# Patient Record
Sex: Female | Born: 1945 | Race: White | Hispanic: No | Marital: Married | Smoking: Never smoker
Health system: Southern US, Community
[De-identification: ages and names within clinical notes are randomized; demographics above are authoritative.]

## PROBLEM LIST (undated history)

## (undated) DIAGNOSIS — R945 Abnormal results of liver function studies: Secondary | ICD-10-CM

## (undated) DIAGNOSIS — Z86010 Personal history of colonic polyps: Secondary | ICD-10-CM

## (undated) DIAGNOSIS — K219 Gastro-esophageal reflux disease without esophagitis: Secondary | ICD-10-CM

## (undated) DIAGNOSIS — L709 Acne, unspecified: Secondary | ICD-10-CM

## (undated) DIAGNOSIS — T7840XA Allergy, unspecified, initial encounter: Secondary | ICD-10-CM

## (undated) DIAGNOSIS — F419 Anxiety disorder, unspecified: Secondary | ICD-10-CM

## (undated) DIAGNOSIS — M8580 Other specified disorders of bone density and structure, unspecified site: Secondary | ICD-10-CM

## (undated) DIAGNOSIS — G709 Myoneural disorder, unspecified: Secondary | ICD-10-CM

## (undated) DIAGNOSIS — L210 Seborrhea capitis: Secondary | ICD-10-CM

## (undated) DIAGNOSIS — H269 Unspecified cataract: Secondary | ICD-10-CM

## (undated) DIAGNOSIS — R197 Diarrhea, unspecified: Secondary | ICD-10-CM

## (undated) DIAGNOSIS — J209 Acute bronchitis, unspecified: Secondary | ICD-10-CM

## (undated) DIAGNOSIS — H409 Unspecified glaucoma: Secondary | ICD-10-CM

## (undated) DIAGNOSIS — M1990 Unspecified osteoarthritis, unspecified site: Secondary | ICD-10-CM

## (undated) DIAGNOSIS — Z8619 Personal history of other infectious and parasitic diseases: Secondary | ICD-10-CM

## (undated) HISTORY — DX: Gastro-esophageal reflux disease without esophagitis: K21.9

## (undated) HISTORY — PX: EYE SURGERY: SHX253

## (undated) HISTORY — PX: BREAST SURGERY: SHX581

## (undated) HISTORY — PX: COLONOSCOPY: SHX174

## (undated) HISTORY — DX: Seborrhea capitis: L21.0

## (undated) HISTORY — DX: Diarrhea, unspecified: R19.7

## (undated) HISTORY — DX: Myoneural disorder, unspecified: G70.9

## (undated) HISTORY — DX: Unspecified glaucoma: H40.9

## (undated) HISTORY — DX: Allergy, unspecified, initial encounter: T78.40XA

## (undated) HISTORY — DX: Anxiety disorder, unspecified: F41.9

## (undated) HISTORY — DX: Personal history of other infectious and parasitic diseases: Z86.19

## (undated) HISTORY — DX: Abnormal results of liver function studies: R94.5

## (undated) HISTORY — DX: Personal history of colonic polyps: Z86.010

## (undated) HISTORY — DX: Acne, unspecified: L70.9

## (undated) HISTORY — DX: Unspecified osteoarthritis, unspecified site: M19.90

## (undated) HISTORY — DX: Other specified disorders of bone density and structure, unspecified site: M85.80

## (undated) HISTORY — DX: Acute bronchitis, unspecified: J20.9

## (undated) HISTORY — DX: Unspecified cataract: H26.9

---

## 1993-03-13 HISTORY — PX: BACK SURGERY: SHX140

## 2003-01-22 LAB — LIPID PANEL: HDL: 64 mg/dL (ref 35–70)

## 2006-03-13 LAB — HM COLONOSCOPY: HM COLON: NORMAL

## 2008-10-06 ENCOUNTER — Encounter: Admission: RE | Admit: 2008-10-06 | Discharge: 2008-10-06 | Payer: Self-pay | Admitting: Internal Medicine

## 2009-05-17 ENCOUNTER — Ambulatory Visit: Payer: Self-pay | Admitting: Diagnostic Radiology

## 2009-05-17 ENCOUNTER — Emergency Department (HOSPITAL_BASED_OUTPATIENT_CLINIC_OR_DEPARTMENT_OTHER): Admission: EM | Admit: 2009-05-17 | Discharge: 2009-05-17 | Payer: Self-pay | Admitting: Emergency Medicine

## 2010-01-13 ENCOUNTER — Encounter: Admission: RE | Admit: 2010-01-13 | Discharge: 2010-01-13 | Payer: Self-pay | Admitting: Gynecology

## 2010-06-06 LAB — DIFFERENTIAL
Basophils Relative: 2 % — ABNORMAL HIGH (ref 0–1)
Eosinophils Absolute: 0.1 10*3/uL (ref 0.0–0.7)
Lymphs Abs: 1.5 10*3/uL (ref 0.7–4.0)
Monocytes Absolute: 0.4 10*3/uL (ref 0.1–1.0)
Monocytes Relative: 8 % (ref 3–12)

## 2010-06-06 LAB — CBC
Hemoglobin: 13.6 g/dL (ref 12.0–15.0)
MCHC: 34 g/dL (ref 30.0–36.0)
MCV: 94.5 fL (ref 78.0–100.0)
RBC: 4.23 MIL/uL (ref 3.87–5.11)
WBC: 4.6 10*3/uL (ref 4.0–10.5)

## 2010-06-06 LAB — BASIC METABOLIC PANEL
CO2: 33 mEq/L — ABNORMAL HIGH (ref 19–32)
Chloride: 106 mEq/L (ref 96–112)
GFR calc Af Amer: >60 mL/min (ref >60)
Potassium: 4.4 mEq/L (ref 3.5–5.1)
Sodium: 145 mEq/L (ref 135–145)

## 2010-06-06 LAB — POCT CARDIAC MARKERS
Myoglobin, poc: 48.2 ng/mL (ref 12–200)
Troponin i, poc: <0.05 ng/mL (ref 0.00–0.09)

## 2010-12-30 ENCOUNTER — Other Ambulatory Visit: Payer: Self-pay | Admitting: Internal Medicine

## 2010-12-30 DIAGNOSIS — Z1231 Encounter for screening mammogram for malignant neoplasm of breast: Secondary | ICD-10-CM

## 2011-01-19 ENCOUNTER — Ambulatory Visit
Admission: RE | Admit: 2011-01-19 | Discharge: 2011-01-19 | Disposition: A | Discharge status: Home / Self Care | Payer: Federal, State, Local not specified - PPO | Source: Ambulatory Visit | Attending: Internal Medicine | Admitting: Internal Medicine

## 2011-01-19 DIAGNOSIS — Z1231 Encounter for screening mammogram for malignant neoplasm of breast: Secondary | ICD-10-CM

## 2011-12-29 ENCOUNTER — Other Ambulatory Visit: Payer: Self-pay | Admitting: Internal Medicine

## 2011-12-29 DIAGNOSIS — Z1231 Encounter for screening mammogram for malignant neoplasm of breast: Secondary | ICD-10-CM

## 2012-02-16 ENCOUNTER — Ambulatory Visit: Payer: Federal, State, Local not specified - PPO

## 2012-03-13 LAB — HM PAP SMEAR: HM Pap smear: NORMAL

## 2012-03-13 LAB — HM DEXA SCAN

## 2012-04-01 ENCOUNTER — Ambulatory Visit
Admission: RE | Admit: 2012-04-01 | Discharge: 2012-04-01 | Disposition: A | Discharge status: Home / Self Care | Payer: PRIVATE HEALTH INSURANCE | Source: Ambulatory Visit | Attending: Internal Medicine | Admitting: Internal Medicine

## 2012-04-01 DIAGNOSIS — Z1231 Encounter for screening mammogram for malignant neoplasm of breast: Secondary | ICD-10-CM

## 2012-05-20 ENCOUNTER — Other Ambulatory Visit: Payer: Self-pay | Admitting: Family Medicine

## 2012-05-20 DIAGNOSIS — M8580 Other specified disorders of bone density and structure, unspecified site: Secondary | ICD-10-CM

## 2012-06-04 ENCOUNTER — Ambulatory Visit
Admission: RE | Admit: 2012-06-04 | Discharge: 2012-06-04 | Disposition: A | Discharge status: Home / Self Care | Payer: PRIVATE HEALTH INSURANCE | Source: Ambulatory Visit | Attending: Family Medicine | Admitting: Family Medicine

## 2012-06-04 DIAGNOSIS — M8580 Other specified disorders of bone density and structure, unspecified site: Secondary | ICD-10-CM

## 2012-06-04 LAB — HM DEXA SCAN

## 2013-01-11 LAB — LIPID PANEL
Cholesterol: 196 mg/dL (ref 0–200)
LDL CALC: 103 mg/dL
LDL/HDL RATIO: 3.1
Triglycerides: 147 mg/dL (ref 40–160)

## 2013-01-11 LAB — HEPATIC FUNCTION PANEL
ALT: 21 U/L (ref 7–35)
AST: 17 U/L (ref 13–35)
Alkaline Phosphatase: 42 U/L (ref 25–125)
Bilirubin, Total: 0.6 mg/dL

## 2013-01-11 LAB — BASIC METABOLIC PANEL
Creatinine: 0.8 mg/dL (ref <=1.1)
Glucose: 93 mg/dL

## 2013-03-13 LAB — HM MAMMOGRAPHY: HM Mammogram: NORMAL

## 2013-03-31 ENCOUNTER — Ambulatory Visit: Payer: PRIVATE HEALTH INSURANCE | Admitting: Family Medicine

## 2013-05-09 ENCOUNTER — Ambulatory Visit (INDEPENDENT_AMBULATORY_CARE_PROVIDER_SITE_OTHER): Payer: PRIVATE HEALTH INSURANCE | Admitting: Family Medicine

## 2013-05-09 ENCOUNTER — Encounter: Payer: Self-pay | Admitting: Family Medicine

## 2013-05-09 VITALS — BP 122/80 | HR 73 | Temp 98.4°F | Resp 16 | Ht 69.5 in | Wt 206.0 lb

## 2013-05-09 DIAGNOSIS — E663 Overweight: Secondary | ICD-10-CM | POA: Insufficient documentation

## 2013-05-09 DIAGNOSIS — M5412 Radiculopathy, cervical region: Secondary | ICD-10-CM

## 2013-05-09 DIAGNOSIS — M4722 Other spondylosis with radiculopathy, cervical region: Secondary | ICD-10-CM

## 2013-05-09 DIAGNOSIS — H409 Unspecified glaucoma: Secondary | ICD-10-CM

## 2013-05-09 DIAGNOSIS — Z23 Encounter for immunization: Secondary | ICD-10-CM

## 2013-05-09 NOTE — Assessment & Plan Note (Signed)
New to provider, ongoing for pt.  Following w/ Dr Zadie Rhine and Herbert Deaner regularly.  Will follow along and assist as able.

## 2013-05-09 NOTE — Assessment & Plan Note (Signed)
New to provider, ongoing for pt.  Will take tramadol and ASA prn.  Has seen Dr Vertell Limber in the past.  Plan was to f/u as needed.

## 2013-05-09 NOTE — Progress Notes (Signed)
   Subjective:    Patient ID: Denise Haas, female    DOB: 10-19-1945, 68 y.o.   MRN: [id number]  HPI New to establish.  Previous MD- White (only saw a few times).  GYN- Mezzer.  Ophtho- [facility], [facility]cker.  CPE was Nov 2014, UTD on GYN, colonoscopy.  'i really have a hard time finding a doctor that i like'  'i'm not a snob at all'  Glaucoma- chronic problem, seeing multiple eye specialists and s/p procedures  Overweight- pt reports she is trying to eat less and avoid being sedentary.  Doing yoga twice weekly and low impact aerobics twice weekly, resistance band training twice weekly.  Will walk regularly when weather is nice.  Denies CP, SOB, HAs, edema.  Back pain- chronic problem, taking tramadol, ASA.  'Advanced degenerative changes of cervical spine'.  Has seen Dr Vertell Limber previously.  Will occasionally have weakness/numbness down L arm.  Pneumovax- due   Review of Systems For ROS see HPI     Objective:   Physical Exam  Vitals reviewed. Constitutional: She is oriented to person, place, and time. She appears well-developed and well-nourished. No distress.  HENT:  Head: Normocephalic and atraumatic.  Eyes: Conjunctivae and EOM are normal. Pupils are equal, round, and reactive to light.  Neck: Normal range of motion. Neck supple. No thyromegaly present.  Cardiovascular: Normal rate, regular rhythm, normal heart sounds and intact distal pulses.   No murmur heard. Pulmonary/Chest: Effort normal and breath sounds normal. No respiratory distress.  Abdominal: Soft. She exhibits no distension. There is no tenderness.  Musculoskeletal: She exhibits no edema.  Lymphadenopathy:    She has no cervical adenopathy.  Neurological: She is alert and oriented to person, place, and time.  Skin: Skin is warm and dry.  Psychiatric: She has a normal mood and affect. Her behavior is normal.          Assessment & Plan:

## 2013-05-09 NOTE — Assessment & Plan Note (Signed)
New.  Pt is attempting to follow healthy diet and does get a fair amount of exercise.  Encouraged her to keep track of food intake to determine her total caloric intake for the day.  Will follow.

## 2013-05-09 NOTE — Patient Instructions (Signed)
Schedule your complete physical for November Try and make healthy food choices and get regular exercise Keep up the good work!  You look great! Call with any questions or concerns Welcome!  We're glad to have you!!!

## 2013-05-10 ENCOUNTER — Encounter: Payer: Self-pay | Admitting: Family Medicine

## 2013-06-17 ENCOUNTER — Encounter: Payer: Self-pay | Admitting: General Practice

## 2013-09-05 ENCOUNTER — Encounter: Payer: Self-pay | Admitting: Family Medicine

## 2013-09-05 ENCOUNTER — Ambulatory Visit (INDEPENDENT_AMBULATORY_CARE_PROVIDER_SITE_OTHER): Payer: PRIVATE HEALTH INSURANCE | Admitting: Family Medicine

## 2013-09-05 VITALS — BP 108/64 | HR 95 | Temp 98.0°F | Resp 16 | Wt 207.1 lb

## 2013-09-05 DIAGNOSIS — M6740 Ganglion, unspecified site: Secondary | ICD-10-CM | POA: Insufficient documentation

## 2013-09-05 DIAGNOSIS — L989 Disorder of the skin and subcutaneous tissue, unspecified: Secondary | ICD-10-CM

## 2013-09-05 MED ORDER — ESTRADIOL 10 MCG VA TABS: 1.0000 | ORAL_TABLET | VAGINAL | Status: DC

## 2013-09-05 NOTE — Progress Notes (Signed)
Pre visit review using our clinic review tool, if applicable. No additional management support is needed unless otherwise documented below in the visit note. 

## 2013-09-05 NOTE — Patient Instructions (Signed)
Follow up as needed We'll call you with your hand specialist and derm appt Your refill was sent Call with any questions or concerns Have a great summer!

## 2013-09-05 NOTE — Progress Notes (Signed)
   Subjective:    Patient ID: TELINA KLECKLEY, female    DOB: [birth year], 68 y.o.   MRN: [id number]  HPI Knot on back of hand- R hand, noted 2 weeks ago.  No initial bruising or pain but subsequently bruised.  Knot remains.  Intermittently painful.  Skin lesions- pt has area on anterior lower R leg that she feels is concerning for basal cell carcinoma.  Has multiple 'crusting' lesions on face and L ear.  Has seen Dr Valli Glance and a [facility] and was unhappy w/ each.   Review of Systems For ROS see HPI     Objective:   Physical Exam  Vitals reviewed. Constitutional: She appears well-developed and well-nourished. No distress.  Cardiovascular: Intact distal pulses.   Musculoskeletal:  R wrist- normal ROM, soft tissue ~2 cm mass on dorsum of R hand consistent w/ ganglion cyst  Skin: Skin is warm and dry.  ~1 cm area of erythema and crusting of R lower leg Multiple dry scaling areas on face and ears          Assessment & Plan:

## 2013-09-07 NOTE — Assessment & Plan Note (Signed)
New.  Intermittently uncomfortable.  Will refer to hand specialist for evaluation and tx

## 2013-09-07 NOTE — Assessment & Plan Note (Signed)
New.  Pt concerned for basal cell carcinoma.  Refer to Derm for complete evaluation and tx.

## 2014-01-06 ENCOUNTER — Encounter: Payer: Self-pay | Admitting: Family Medicine

## 2014-02-03 ENCOUNTER — Other Ambulatory Visit: Payer: Self-pay | Admitting: Family Medicine

## 2014-02-03 ENCOUNTER — Encounter: Payer: Self-pay | Admitting: Family Medicine

## 2014-02-03 ENCOUNTER — Ambulatory Visit (INDEPENDENT_AMBULATORY_CARE_PROVIDER_SITE_OTHER): Payer: PRIVATE HEALTH INSURANCE | Admitting: Family Medicine

## 2014-02-03 VITALS — BP 112/64 | HR 81 | Temp 98.3°F | Resp 16 | Ht 69.5 in | Wt 209.1 lb

## 2014-02-03 DIAGNOSIS — Z0000 Encounter for general adult medical examination without abnormal findings: Secondary | ICD-10-CM | POA: Insufficient documentation

## 2014-02-03 LAB — CBC WITH DIFFERENTIAL/PLATELET
BASOS ABS: 0 10*3/uL (ref 0.0–0.1)
BASOS PCT: 0 % (ref 0–1)
EOS ABS: 0.2 10*3/uL (ref 0.0–0.7)
EOS PCT: 4 % (ref 0–5)
HEMATOCRIT: 40.5 % (ref 36.0–46.0)
Hemoglobin: 13.6 g/dL (ref 12.0–15.0)
LYMPHS PCT: 37 % (ref 12–46)
Lymphs Abs: 1.7 10*3/uL (ref 0.7–4.0)
MCH: 31.7 pg (ref 26.0–34.0)
MCHC: 33.6 g/dL (ref 30.0–36.0)
MCV: 94.4 fL (ref 78.0–100.0)
MONO ABS: 0.4 10*3/uL (ref 0.1–1.0)
MPV: 10.2 fL (ref 9.4–12.4)
Monocytes Relative: 8 % (ref 3–12)
Neutro Abs: 2.3 10*3/uL (ref 1.7–7.7)
Neutrophils Relative %: 51 % (ref 43–77)
Platelets: 321 10*3/uL (ref 150–400)
RBC: 4.29 MIL/uL (ref 3.87–5.11)
RDW: 13.6 % (ref 11.5–15.5)
WBC: 4.6 10*3/uL (ref 4.0–10.5)

## 2014-02-03 LAB — LIPID PANEL
CHOLESTEROL: 199 mg/dL (ref 0–200)
HDL: 56 mg/dL (ref >39)
LDL Cholesterol: 117 mg/dL — ABNORMAL HIGH (ref 0–99)
Total CHOL/HDL Ratio: 3.6 Ratio
Triglycerides: 131 mg/dL (ref <150)
VLDL: 26 mg/dL (ref 0–40)

## 2014-02-03 LAB — HEPATIC FUNCTION PANEL
ALT: 18 U/L (ref 0–35)
AST: 17 U/L (ref 0–37)
Albumin: 4.3 g/dL (ref 3.5–5.2)
Alkaline Phosphatase: 42 U/L (ref 39–117)
BILIRUBIN DIRECT: 0.1 mg/dL (ref 0.0–0.3)
BILIRUBIN INDIRECT: 0.4 mg/dL (ref 0.2–1.2)
Total Bilirubin: 0.5 mg/dL (ref 0.2–1.2)
Total Protein: 7.1 g/dL (ref 6.0–8.3)

## 2014-02-03 LAB — BASIC METABOLIC PANEL
BUN: 14 mg/dL (ref 6–23)
CO2: 30 mEq/L (ref 19–32)
CREATININE: 0.71 mg/dL (ref 0.50–1.10)
Calcium: 10 mg/dL (ref 8.4–10.5)
Chloride: 102 mEq/L (ref 96–112)
GLUCOSE: 91 mg/dL (ref 70–99)
Potassium: 4.7 mEq/L (ref 3.5–5.3)
Sodium: 140 mEq/L (ref 135–145)

## 2014-02-03 LAB — TSH: TSH: 1.443 u[IU]/mL (ref 0.350–4.500)

## 2014-02-03 MED ORDER — TRAMADOL HCL 50 MG PO TABS: 50.0000 mg | ORAL_TABLET | Freq: Two times a day (BID) | ORAL | Status: DC | PRN

## 2014-02-03 NOTE — Assessment & Plan Note (Signed)
Pt's PE WNL.  UTD on health maintenance.  Check labs.  Anticipatory guidance provided.  

## 2014-02-03 NOTE — Progress Notes (Signed)
Pre visit review using our clinic review tool, if applicable. No additional management support is needed unless otherwise documented below in the visit note. 

## 2014-02-03 NOTE — Patient Instructions (Signed)
Follow up in 1 year or as needed We'll notify you of your lab results and make any changes if needed Try and make healthy food choices and get regular exercise Call with any questions or concerns Happy Holidays!  Have a great trip!

## 2014-02-03 NOTE — Progress Notes (Signed)
   Subjective:    Patient ID: Denise Haas, female    DOB: Sep 30, 1945, 68 y.o.   MRN: [id number]  HPI CPE- UTD on colonoscopy, mammo, pap, DEXA.  GYN- Mezzer  Ophtho- [facility]   Review of Systems Patient reports no vision/ hearing changes, adenopathy,fever, weight change,  persistant/recurrent hoarseness , swallowing issues, chest pain, palpitations, edema, persistant/recurrent cough, hemoptysis, dyspnea (rest/exertional/paroxysmal nocturnal), gastrointestinal bleeding (melena, rectal bleeding), abdominal pain, significant heartburn, bowel changes, GU symptoms (dysuria, hematuria, incontinence), Gyn symptoms (abnormal  bleeding, pain),  syncope, focal weakness, memory loss, numbness & tingling, skin/hair/nail changes, abnormal bruising or bleeding, anxiety, or depression.     Objective:   Physical Exam General Appearance:    Alert, cooperative, no distress, appears stated age  Head:    Normocephalic, without obvious abnormality, atraumatic  Eyes:    PERRL, conjunctiva/corneas clear, EOM's intact, fundi    benign, both eyes  Ears:    Normal TM's and external ear canals, both ears  Nose:   Nares normal, septum midline, mucosa normal, no drainage    or sinus tenderness  Throat:   Lips, mucosa, and tongue normal; teeth and gums normal  Neck:   Supple, symmetrical, trachea midline, no adenopathy;    Thyroid: no enlargement/tenderness/nodules  Back:     Symmetric, no curvature, ROM normal, no CVA tenderness  Lungs:     Clear to auscultation bilaterally, respirations unlabored  Chest Wall:    No tenderness or deformity   Heart:    Regular rate and rhythm, S1 and S2 normal, no murmur, rub   or gallop  Breast Exam:    Deferred to GYN  Abdomen:     Soft, non-tender, bowel sounds active all four quadrants,    no masses, no organomegaly  Genitalia:    Deferred to GYN  Rectal:    Extremities:   Extremities normal, atraumatic, no cyanosis or edema  Pulses:   2+ and symmetric all extremities  Skin:    Skin color, texture, turgor normal, no rashes or lesions  Lymph nodes:   Cervical, supraclavicular, and axillary nodes normal  Neurologic:   CNII-XII intact, normal strength, sensation and reflexes    throughout          Assessment & Plan:

## 2014-02-04 ENCOUNTER — Encounter: Payer: Self-pay | Admitting: Family Medicine

## 2014-02-04 LAB — VITAMIN D 25 HYDROXY (VIT D DEFICIENCY, FRACTURES): VIT D 25 HYDROXY: 32 ng/mL (ref 30–100)

## 2014-04-09 ENCOUNTER — Other Ambulatory Visit: Payer: Self-pay | Admitting: Family Medicine

## 2014-04-09 ENCOUNTER — Encounter: Payer: Self-pay | Admitting: Family Medicine

## 2014-04-09 MED ORDER — TRAMADOL HCL 50 MG PO TABS: 50.0000 mg | ORAL_TABLET | Freq: Two times a day (BID) | ORAL | Status: DC | PRN

## 2014-04-09 NOTE — Telephone Encounter (Signed)
Med filled and faxed.  

## 2014-04-09 NOTE — Telephone Encounter (Signed)
Last OV 02-03-14 Tramadol last filled 02-03-14 #30 with 0

## 2014-04-09 NOTE — Telephone Encounter (Signed)
Ok to change pharmacy as requested.  [place] for #90, no refills

## 2014-08-11 ENCOUNTER — Encounter: Payer: Self-pay | Admitting: General Practice

## 2014-08-11 ENCOUNTER — Other Ambulatory Visit: Payer: Self-pay | Admitting: Family Medicine

## 2014-08-11 MED ORDER — TRAMADOL HCL 50 MG PO TABS: 50.0000 mg | ORAL_TABLET | Freq: Two times a day (BID) | ORAL | Status: DC | PRN

## 2014-08-11 NOTE — Telephone Encounter (Signed)
Pt notified, rx printed and placed at front desk with [facility].

## 2014-08-11 NOTE — Telephone Encounter (Signed)
Last OV 02-03-14 (CPE-Return in 1 year) Tramadol last filled 04/09/14 #90 with 0   NO CSC on file, pt has only received twice.

## 2014-08-11 NOTE — Telephone Encounter (Signed)
Will allow in PCP absence.  However she will need to pick up Rx and fill out [place] per office policy.

## 2014-08-24 ENCOUNTER — Encounter: Payer: Self-pay | Admitting: Family Medicine

## 2014-08-25 ENCOUNTER — Ambulatory Visit (HOSPITAL_BASED_OUTPATIENT_CLINIC_OR_DEPARTMENT_OTHER)
Admission: RE | Admit: 2014-08-25 | Discharge: 2014-08-25 | Disposition: A | Discharge status: Home / Self Care | Payer: PRIVATE HEALTH INSURANCE | Source: Ambulatory Visit | Attending: Medical | Admitting: Medical

## 2014-08-25 ENCOUNTER — Other Ambulatory Visit: Payer: Self-pay | Admitting: Medical

## 2014-08-25 ENCOUNTER — Ambulatory Visit (INDEPENDENT_AMBULATORY_CARE_PROVIDER_SITE_OTHER): Payer: PRIVATE HEALTH INSURANCE | Admitting: Medical

## 2014-08-25 ENCOUNTER — Encounter: Payer: Self-pay | Admitting: Medical

## 2014-08-25 VITALS — BP 130/90 | HR 92 | Temp 98.1°F | Ht 69.5 in | Wt 215.8 lb

## 2014-08-25 DIAGNOSIS — M542 Cervicalgia: Secondary | ICD-10-CM

## 2014-08-25 DIAGNOSIS — M47892 Other spondylosis, cervical region: Secondary | ICD-10-CM | POA: Insufficient documentation

## 2014-08-25 MED ORDER — HYDROCODONE-ACETAMINOPHEN 5-325 MG PO TABS: 1.0000 | ORAL_TABLET | Freq: Four times a day (QID) | ORAL | Status: DC | PRN

## 2014-08-25 MED ORDER — CYCLOBENZAPRINE HCL 10 MG PO TABS: 10.0000 mg | ORAL_TABLET | Freq: Every day | ORAL | Status: DC

## 2014-08-25 NOTE — Progress Notes (Signed)
Subjective:    Patient ID: Denise Haas, female    DOB: Oct 23, 1945, 69 y.o.   MRN: [id number]  HPI  Pt in states 1 wk of neck pain. Pt states just recently on past Wednesday pain came on in am. She was at beach and carrying some beach equipment day before. Pt has been taking advil, aspirin and tramadol. But pain level  7-8/10 is still present. Also some lifting of a lot of groceries while on vacation as well. Also has hx of cervical radiculopathy with degenerative joint disease.(For a while she states neck pain has been controlled.   No report of ha, no nausea, no fever, no vomiting.  No trauma or fall preceding neck pain.    Review of Systems  Constitutional: Negative for fever, chills and fatigue.  Respiratory: Negative for cough, chest tightness, shortness of breath and wheezing.   Cardiovascular: Negative for chest pain and palpitations.  Musculoskeletal: Positive for neck pain. Negative for back pain and arthralgias.       No radicular pain to arms.  Neurological: Negative for dizziness and headaches.  Hematological: Negative for adenopathy. Does not bruise/bleed easily.  Psychiatric/Behavioral: Negative for behavioral problems and confusion.    Past Medical History  Diagnosis Date  . Arthritis   . GERD (gastroesophageal reflux disease)   . Glaucoma   . History of colonic polyps     History   Social History  . Marital Status: Married    Spouse Name: N/A  . Number of Children: N/A  . Years of Education: N/A   Occupational History  . Not on file.   Social History Main Topics  . Smoking status: Never Smoker   . Smokeless tobacco: Not on file  . Alcohol Use: Yes  . Drug Use: No  . Sexual Activity: No   Other Topics Concern  . Not on file   Social History Narrative    Past Surgical History  Procedure Laterality Date  . Breast surgery    . Eye surgery    . Back surgery      Family History  Problem Relation Age of Onset  . Cancer Mother     breast    . Heart disease Mother   . Diabetes Mother   . COPD Father   . Heart disease Father   . Heart disease Maternal Grandmother   . Stroke Maternal Grandfather     Allergies  Allergen Reactions  . Amoxicillin Hives  . Aspirin Hives  . Erythromycin Hives and Rash  . Ibuprofen Hives  . Latex Rash  . Tetracyclines & Related Hives  . Triple Antibiotic [Bacitracin-Neomycin-Polymyxin] Rash    Current Outpatient Prescriptions on File Prior to Visit  Medication Sig Dispense Refill  . Biotin 300 MCG TABS Take 1 tablet by mouth daily.    . Calcium Carb-Cholecalciferol (CALCIUM + D3) 600-200 MG-UNIT TABS Take 1 tablet by mouth 2 (two) times daily.    . Cholecalciferol (VITAMIN D3) 2000 UNITS TABS Take 1 tablet by mouth daily.    . Cyanocobalamin (B-12) 2500 MCG TABS Take 1 tablet by mouth daily.    Marland Kitchen latanoprost (XALATAN) 0.005 % ophthalmic solution     . Melatonin 2.5 MG CAPS Take 1 capsule by mouth Nightly.    . metroNIDAZOLE (METROGEL) 0.75 % vaginal gel     . Polyvinyl Alcohol-Povidone (REFRESH OP) Apply to eye.    . Probiotic Product (PROBIOTIC DAILY PO) Take by mouth.    . traMADol (ULTRAM) 50 MG  tablet Take 1 tablet (50 mg total) by mouth every 12 (twelve) hours as needed. 90 tablet 0  . tretinoin (RETIN-A) 0.025 % cream Apply topically at bedtime.    . Estradiol (VAGIFEM) 10 MCG TABS vaginal tablet Place 1 tablet (10 mcg total) vaginally once a week. 12 tablet 3   No current facility-administered medications on file prior to visit.    BP 130/90 mmHg  Pulse 92  Temp(Src) 98.1 F (36.7 C) (Oral)  Ht 5' 9.5" (1.765 m)  Wt 215 lb 12.8 oz (97.886 kg)  BMI 31.42 kg/m2  SpO2 98%      Objective:   Physical Exam  General- No acute distress. Pleasant patient. Neck- Full range of motion, no jvd. But turning head to rt causes rt trap pain. Chin to chest rt trap pain. Faint mid c spine pain. Rt mid neck paracervical tenderness. Muscle feels tight. Lungs- Clear, even and  unlabored. Heart- regular rate and rhythm. Neurologic- CNII- XII grossly intact.  Upper ext- equal 5/5 symmetric strength.       Assessment & Plan:

## 2014-08-25 NOTE — Assessment & Plan Note (Signed)
Most of pain is on rt side. Some faint mid cspine. Will get xray. Rx flexeril. Rx hydrocodone. Stop tramadol.  Follow up 7 days or as needed.  If by Friday  No improved call and let us know. Expect some improvement by then.

## 2014-08-25 NOTE — Patient Instructions (Signed)
Neck pain Most of pain is on rt side. Some faint mid cspine. Will get xray. Rx flexeril. Rx hydrocodone. Stop tramadol.  Follow up 7 days or as needed.  If by Friday  No improved call and let us know. Expect some improvement by then.

## 2014-08-25 NOTE — Progress Notes (Signed)
Pre visit review using our clinic review tool, if applicable. No additional management support is needed unless otherwise documented below in the visit note. 

## 2014-09-06 ENCOUNTER — Other Ambulatory Visit: Payer: Self-pay | Admitting: Medical

## 2014-09-07 MED ORDER — CYCLOBENZAPRINE HCL 10 MG PO TABS: 10.0000 mg | ORAL_TABLET | Freq: Every day | ORAL | Status: DC

## 2014-11-06 ENCOUNTER — Encounter: Payer: Self-pay | Admitting: Family Medicine

## 2014-11-06 MED ORDER — TRAMADOL HCL 50 MG PO TABS: 50.0000 mg | ORAL_TABLET | Freq: Two times a day (BID) | ORAL | Status: DC | PRN

## 2014-11-06 NOTE — Telephone Encounter (Signed)
Medication filled to pharmacy as requested.   

## 2014-11-06 NOTE — Telephone Encounter (Signed)
Last OV 02/03/14, CPE scheduled for 01/2015 Tramadol last filled 08/10/14 #90 with 0 ([facility])

## 2014-11-19 ENCOUNTER — Ambulatory Visit (INDEPENDENT_AMBULATORY_CARE_PROVIDER_SITE_OTHER): Payer: PRIVATE HEALTH INSURANCE | Admitting: Family Medicine

## 2014-11-19 ENCOUNTER — Encounter: Payer: Self-pay | Admitting: Family Medicine

## 2014-11-19 VITALS — BP 118/72 | HR 97 | Temp 98.0°F | Resp 16 | Wt 216.2 lb

## 2014-11-19 DIAGNOSIS — M25512 Pain in left shoulder: Secondary | ICD-10-CM

## 2014-11-19 MED ORDER — TRETINOIN 0.025 % EX CREA: TOPICAL_CREAM | Freq: Every day | CUTANEOUS | Status: DC

## 2014-11-19 MED ORDER — PREDNISONE 10 MG PO TABS: ORAL_TABLET | ORAL | Status: DC

## 2014-11-19 NOTE — Patient Instructions (Signed)
Follow up by phone or MyChart in 2 weeks to let me know if we need to proceed w/ PT or ortho referral Start the Prednisone as directed- take w/ food Alternate heat and ice for the pain Call with any questions or concerns Hang in there!!!

## 2014-11-19 NOTE — Assessment & Plan Note (Signed)
New.  Pt's pain consistent w/ possible rotator cuff strain.  No evidence of biceps tendonitis or bursitis on PE.  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  Offered PT referral or sports med but pt declined at this time.  Reviewed supportive care and red flags that should prompt return.  Pt expressed understanding and is in agreement w/ plan.

## 2014-11-19 NOTE — Progress Notes (Signed)
   Subjective:    Patient ID: Denise Haas, female    DOB: 02/15/1946, 69 y.o.   MRN: [id number]  HPI L shoulder pain- sxs started 'at least 3 weeks' ago.  sxs started during water aerobics and pt thought pain would improve once class was over but sxs have persisted.  Painful to sleep on.  No improvement w/ tramadol.  Pain w/ certain movements.  Painful w/ internal rotation, pain w/ abduction and overhead motion.  No known injury.   Pain improves w/ ibuprofen use.  No swelling.  No hx of shoulder pain.   Review of Systems For ROS see HPI     Objective:   Physical Exam  Constitutional: She is oriented to person, place, and time. She appears well-developed and well-nourished. No distress.  HENT:  Head: Normocephalic and atraumatic.  Neck: Normal range of motion. Neck supple.  Cardiovascular: Intact distal pulses.   Musculoskeletal: She exhibits no edema or tenderness (not TTP over L clavicle, humeral head, scapula).  Discomfort w/ abduction of L arm starting at 90 degrees, pain w/ internal rotation  Neurological: She is alert and oriented to person, place, and time. She has normal reflexes.  Skin: Skin is warm and dry. No rash noted. No erythema.  Psychiatric: She has a normal mood and affect. Her behavior is normal. Thought content normal.  Vitals reviewed.         Assessment & Plan:

## 2014-11-19 NOTE — Progress Notes (Signed)
Pre visit review using our clinic review tool, if applicable. No additional management support is needed unless otherwise documented below in the visit note. 

## 2014-12-14 ENCOUNTER — Telehealth: Payer: Self-pay | Admitting: Family Medicine

## 2014-12-14 NOTE — Telephone Encounter (Signed)
Caller name: Denise Haas   Relationship to patient: Self   Can be reached: [phone]  Reason for call: Pt says that her shoulder is still in pain. She would like to have a referral to ortho. She says there are a few ortho places on [address] that would be convenient for her.

## 2014-12-15 ENCOUNTER — Encounter: Payer: Self-pay | Admitting: Family Medicine

## 2014-12-15 ENCOUNTER — Other Ambulatory Visit: Payer: Self-pay | Admitting: Family Medicine

## 2014-12-15 DIAGNOSIS — M25512 Pain in left shoulder: Secondary | ICD-10-CM

## 2014-12-15 NOTE — Telephone Encounter (Signed)
[facility] for ortho referral- please specify in [employer]

## 2014-12-15 NOTE — Telephone Encounter (Signed)
Referral placed.

## 2015-01-25 ENCOUNTER — Other Ambulatory Visit: Payer: Self-pay | Admitting: Family Medicine

## 2015-01-25 MED ORDER — PREDNISONE 10 MG PO TABS: ORAL_TABLET | ORAL | Status: DC

## 2015-01-25 MED ORDER — TRAMADOL HCL 50 MG PO TABS: 50.0000 mg | ORAL_TABLET | Freq: Two times a day (BID) | ORAL | Status: DC | PRN

## 2015-01-25 NOTE — Telephone Encounter (Signed)
Medication filled to pharmacy as requested.   

## 2015-01-25 NOTE — Telephone Encounter (Signed)
Last OV 11/19/14 Tramadol last filled 11/06/14 #90 with 0

## 2015-01-25 NOTE — Addendum Note (Signed)
Addended by: Midge Minium on: 01/25/2015 03:45 PM   Modules accepted: Orders

## 2015-02-08 ENCOUNTER — Encounter: Payer: Medicare Other | Admitting: Family Medicine

## 2015-02-15 ENCOUNTER — Encounter: Payer: Self-pay | Admitting: Behavioral Health

## 2015-02-15 ENCOUNTER — Telehealth: Payer: Self-pay | Admitting: Behavioral Health

## 2015-02-15 NOTE — Telephone Encounter (Signed)
Pre-Visit Call completed with patient and chart updated.   Pre-Visit Info documented in Specialty Comments under [employer].    

## 2015-02-16 ENCOUNTER — Ambulatory Visit (INDEPENDENT_AMBULATORY_CARE_PROVIDER_SITE_OTHER): Payer: PRIVATE HEALTH INSURANCE | Admitting: Family Medicine

## 2015-02-16 ENCOUNTER — Encounter: Payer: Self-pay | Admitting: Family Medicine

## 2015-02-16 ENCOUNTER — Other Ambulatory Visit: Payer: Self-pay | Admitting: Family Medicine

## 2015-02-16 VITALS — BP 134/74 | HR 100 | Temp 98.7°F | Ht 70.0 in | Wt 218.2 lb

## 2015-02-16 DIAGNOSIS — M4722 Other spondylosis with radiculopathy, cervical region: Secondary | ICD-10-CM

## 2015-02-16 DIAGNOSIS — Z23 Encounter for immunization: Secondary | ICD-10-CM | POA: Diagnosis not present

## 2015-02-16 DIAGNOSIS — M25512 Pain in left shoulder: Secondary | ICD-10-CM

## 2015-02-16 DIAGNOSIS — E663 Overweight: Secondary | ICD-10-CM | POA: Diagnosis not present

## 2015-02-16 DIAGNOSIS — R7989 Other specified abnormal findings of blood chemistry: Secondary | ICD-10-CM | POA: Diagnosis not present

## 2015-02-16 DIAGNOSIS — E782 Mixed hyperlipidemia: Secondary | ICD-10-CM

## 2015-02-16 DIAGNOSIS — R945 Abnormal results of liver function studies: Secondary | ICD-10-CM

## 2015-02-16 DIAGNOSIS — Z8619 Personal history of other infectious and parasitic diseases: Secondary | ICD-10-CM | POA: Insufficient documentation

## 2015-02-16 DIAGNOSIS — J209 Acute bronchitis, unspecified: Secondary | ICD-10-CM

## 2015-02-16 DIAGNOSIS — M47812 Spondylosis without myelopathy or radiculopathy, cervical region: Secondary | ICD-10-CM

## 2015-02-16 LAB — COMPREHENSIVE METABOLIC PANEL
ALK PHOS: 49 U/L (ref 33–130)
ALT: 32 U/L — AB (ref 6–29)
AST: 32 U/L (ref 10–35)
Albumin: 3.9 g/dL (ref 3.6–5.1)
BILIRUBIN TOTAL: 0.4 mg/dL (ref 0.2–1.2)
BUN: 11 mg/dL (ref 7–25)
CALCIUM: 9.2 mg/dL (ref 8.6–10.4)
CO2: 30 mmol/L (ref 20–31)
CREATININE: 0.93 mg/dL (ref 0.50–0.99)
Chloride: 106 mmol/L (ref 98–110)
GLUCOSE: 98 mg/dL (ref 65–99)
Potassium: 4.7 mmol/L (ref 3.5–5.3)
Sodium: 141 mmol/L (ref 135–146)
Total Protein: 6.6 g/dL (ref 6.1–8.1)

## 2015-02-16 LAB — CBC WITH DIFFERENTIAL/PLATELET
BASOS ABS: 0 10*3/uL (ref 0.0–0.1)
Basophils Relative: 0 % (ref 0–1)
EOS PCT: 3 % (ref 0–5)
Eosinophils Absolute: 0.2 10*3/uL (ref 0.0–0.7)
HEMATOCRIT: 39.5 % (ref 36.0–46.0)
HEMOGLOBIN: 12.7 g/dL (ref 12.0–15.0)
LYMPHS PCT: 34 % (ref 12–46)
Lymphs Abs: 2 10*3/uL (ref 0.7–4.0)
MCH: 30.9 pg (ref 26.0–34.0)
MCHC: 32.2 g/dL (ref 30.0–36.0)
MCV: 96.1 fL (ref 78.0–100.0)
MPV: 9.6 fL (ref 8.6–12.4)
Monocytes Absolute: 0.5 10*3/uL (ref 0.1–1.0)
Monocytes Relative: 8 % (ref 3–12)
NEUTROS ABS: 3.2 10*3/uL (ref 1.7–7.7)
Neutrophils Relative %: 55 % (ref 43–77)
Platelets: 294 10*3/uL (ref 150–400)
RBC: 4.11 MIL/uL (ref 3.87–5.11)
RDW: 13.6 % (ref 11.5–15.5)
WBC: 5.8 10*3/uL (ref 4.0–10.5)

## 2015-02-16 LAB — LIPID PANEL
CHOLESTEROL: 187 mg/dL (ref 125–200)
HDL: 59 mg/dL (ref >=46)
LDL Cholesterol: 94 mg/dL (ref <130)
TRIGLYCERIDES: 169 mg/dL — AB (ref <150)
Total CHOL/HDL Ratio: 3.2 Ratio (ref <=5.0)
VLDL: 34 mg/dL — ABNORMAL HIGH (ref <30)

## 2015-02-16 MED ORDER — CIPROFLOXACIN HCL 250 MG PO TABS: 250.0000 mg | ORAL_TABLET | Freq: Two times a day (BID) | ORAL | Status: DC

## 2015-02-16 NOTE — Patient Instructions (Signed)
Elderberry or liquid or gummies, [employer] company at [email or web address] Vitamin C 500 to 1000 mg daily, aged garlic or black garlic, zinc 50 mg or coldeeze, Mucinex twice daily  Preventive Care for Adults, Female A healthy lifestyle and preventive care can promote health and wellness. Preventive health guidelines for women include the following key practices.  A routine yearly physical is a good way to check with your health care provider about your health and preventive screening. It is a chance to share any concerns and updates on your health and to receive a thorough exam.  Visit your dentist for a routine exam and preventive care every 6 months. Brush your teeth twice a day and floss once a day. Good oral hygiene prevents tooth decay and gum disease.  The frequency of eye exams is based on your age, health, family medical history, use of contact lenses, and other factors. Follow your health care provider's recommendations for frequency of eye exams.  Eat a healthy diet. Foods like vegetables, fruits, whole grains, low-fat dairy products, and lean protein foods contain the nutrients you need without too many calories. Decrease your intake of foods high in solid fats, added sugars, and salt. Eat the right amount of calories for you.Get information about a proper diet from your health care provider, if necessary.  Regular physical exercise is one of the most important things you can do for your health. Most adults should get at least 150 minutes of moderate-intensity exercise (any activity that increases your heart rate and causes you to sweat) each week. In addition, most adults need muscle-strengthening exercises on 2 or more days a week.  Maintain a healthy weight. The body mass index (BMI) is a screening tool to identify possible weight problems. It provides an estimate of body fat based on height and weight. Your health care provider can find your BMI and can help you achieve or maintain a healthy  weight.For adults 20 years and older:  A BMI below 18.5 is considered underweight.  A BMI of 18.5 to 24.9 is normal.  A BMI of 25 to 29.9 is considered overweight.  A BMI of 30 and above is considered obese.  Maintain normal blood lipids and cholesterol levels by exercising and minimizing your intake of saturated fat. Eat a balanced diet with plenty of fruit and vegetables. Blood tests for lipids and cholesterol should begin at age 5 and be repeated every 5 years. If your lipid or cholesterol levels are high, you are over 50, or you are at high risk for heart disease, you may need your cholesterol levels checked more frequently.Ongoing high lipid and cholesterol levels should be treated with medicines if diet and exercise are not working.  If you smoke, find out from your health care provider how to quit. If you do not use tobacco, do not start.  Lung cancer screening is recommended for adults aged 30-80 years who are at high risk for developing lung cancer because of a history of smoking. A yearly low-dose CT scan of the lungs is recommended for people who have at least a 30-pack-year history of smoking and are a current smoker or have quit within the past 15 years. A pack year of smoking is smoking an average of 1 pack of cigarettes a day for 1 year (for example: 1 pack a day for 30 years or 2 packs a day for 15 years). Yearly screening should continue until the smoker has stopped smoking for at least 15 years. Yearly screening should  be stopped for people who develop a health problem that would prevent them from having lung cancer treatment.  If you are pregnant, do not drink alcohol. If you are breastfeeding, be very cautious about drinking alcohol. If you are not pregnant and choose to drink alcohol, do not have more than 1 drink per day. One drink is considered to be 12 ounces (355 mL) of beer, 5 ounces (148 mL) of wine, or 1.5 ounces (44 mL) of liquor.  Avoid use of street drugs. Do not  share needles with anyone. Ask for help if you need support or instructions about stopping the use of drugs.  High blood pressure causes heart disease and increases the risk of stroke. Your blood pressure should be checked at least every 1 to 2 years. Ongoing high blood pressure should be treated with medicines if weight loss and exercise do not work.  If you are 19-66 years old, ask your health care provider if you should take aspirin to prevent strokes.  Diabetes screening is done by taking a blood sample to check your blood glucose level after you have not eaten for a certain period of time (fasting). If you are not overweight and you do not have risk factors for diabetes, you should be screened once every 3 years starting at age 60. If you are overweight or obese and you are 21-46 years of age, you should be screened for diabetes every year as part of your cardiovascular risk assessment.  Breast cancer screening is essential preventive care for women. You should practice "breast self-awareness." This means understanding the normal appearance and feel of your breasts and may include breast self-examination. Any changes detected, no matter how small, should be reported to a health care provider. Women in their 48s and 30s should have a clinical breast exam (CBE) by a health care provider as part of a regular health exam every 1 to 3 years. After age 65, women should have a CBE every year. Starting at age 57, women should consider having a mammogram (breast X-ray test) every year. Women who have a family history of breast cancer should talk to their health care provider about genetic screening. Women at a high risk of breast cancer should talk to their health care providers about having an MRI and a mammogram every year.  Breast cancer gene (BRCA)-related cancer risk assessment is recommended for women who have family members with BRCA-related cancers. BRCA-related cancers include breast, ovarian, tubal,  and peritoneal cancers. Having family members with these cancers may be associated with an increased risk for harmful changes (mutations) in the breast cancer genes BRCA1 and BRCA2. Results of the assessment will determine the need for genetic counseling and BRCA1 and BRCA2 testing.  Your health care provider may recommend that you be screened regularly for cancer of the pelvic organs (ovaries, uterus, and vagina). This screening involves a pelvic examination, including checking for microscopic changes to the surface of your cervix (Pap test). You may be encouraged to have this screening done every 3 years, beginning at age 31.  For women ages 37-65, health care providers may recommend pelvic exams and Pap testing every 3 years, or they may recommend the Pap and pelvic exam, combined with testing for human papilloma virus (HPV), every 5 years. Some types of HPV increase your risk of cervical cancer. Testing for HPV may also be done on women of any age with unclear Pap test results.  Other health care providers may not recommend any screening  for nonpregnant women who are considered low risk for pelvic cancer and who do not have symptoms. Ask your health care provider if a screening pelvic exam is right for you.  If you have had past treatment for cervical cancer or a condition that could lead to cancer, you need Pap tests and screening for cancer for at least 20 years after your treatment. If Pap tests have been discontinued, your risk factors (such as having a new sexual partner) need to be reassessed to determine if screening should resume. Some women have medical problems that increase the chance of getting cervical cancer. In these cases, your health care provider may recommend more frequent screening and Pap tests.  Colorectal cancer can be detected and often prevented. Most routine colorectal cancer screening begins at the age of 49 years and continues through age 46 years. However, your health care  provider may recommend screening at an earlier age if you have risk factors for colon cancer. On a yearly basis, your health care provider may provide home test kits to check for hidden blood in the stool. Use of a small camera at the end of a tube, to directly examine the colon (sigmoidoscopy or colonoscopy), can detect the earliest forms of colorectal cancer. Talk to your health care provider about this at age 47, when routine screening begins. Direct exam of the colon should be repeated every 5-10 years through age 15 years, unless early forms of precancerous polyps or small growths are found.  People who are at an increased risk for hepatitis B should be screened for this virus. You are considered at high risk for hepatitis B if:  You were born in a country where hepatitis B occurs often. Talk with your health care provider about which countries are considered high risk.  Your parents were born in a high-risk country and you have not received a shot to protect against hepatitis B (hepatitis B vaccine).  You have HIV or AIDS.  You use needles to inject street drugs.  You live with, or have sex with, someone who has hepatitis B.  You get hemodialysis treatment.  You take certain medicines for conditions like cancer, organ transplantation, and autoimmune conditions.  Hepatitis C blood testing is recommended for all people born from [birth year] through 1965 and any individual with known risks for hepatitis C.  Practice safe sex. Use condoms and avoid high-risk sexual practices to reduce the spread of sexually transmitted infections (STIs). STIs include gonorrhea, chlamydia, syphilis, trichomonas, herpes, HPV, and human immunodeficiency virus (HIV). Herpes, HIV, and HPV are viral illnesses that have no cure. They can result in disability, cancer, and death.  You should be screened for sexually transmitted illnesses (STIs) including gonorrhea and chlamydia if:  You are sexually active and are younger  than 24 years.  You are older than 24 years and your health care provider tells you that you are at risk for this type of infection.  Your sexual activity has changed since you were last screened and you are at an increased risk for chlamydia or gonorrhea. Ask your health care provider if you are at risk.  If you are at risk of being infected with HIV, it is recommended that you take a prescription medicine daily to prevent HIV infection. This is called preexposure prophylaxis (PrEP). You are considered at risk if:  You are sexually active and do not regularly use condoms or know the HIV status of your partner(s).  You take drugs by injection.  You are sexually active with a partner who has HIV.  Talk with your health care provider about whether you are at high risk of being infected with HIV. If you choose to begin PrEP, you should first be tested for HIV. You should then be tested every 3 months for as long as you are taking PrEP.  Osteoporosis is a disease in which the bones lose minerals and strength with aging. This can result in serious bone fractures or breaks. The risk of osteoporosis can be identified using a bone density scan. Women ages 68 years and over and women at risk for fractures or osteoporosis should discuss screening with their health care providers. Ask your health care provider whether you should take a calcium supplement or vitamin D to reduce the rate of osteoporosis.  Menopause can be associated with physical symptoms and risks. Hormone replacement therapy is available to decrease symptoms and risks. You should talk to your health care provider about whether hormone replacement therapy is right for you.  Use sunscreen. Apply sunscreen liberally and repeatedly throughout the day. You should seek shade when your shadow is shorter than you. Protect yourself by wearing long sleeves, pants, a wide-brimmed hat, and sunglasses year round, whenever you are outdoors.  Once a  month, do a whole body skin exam, using a mirror to look at the skin on your back. Tell your health care provider of new moles, moles that have irregular borders, moles that are larger than a pencil eraser, or moles that have changed in shape or color.  Stay current with required vaccines (immunizations).  Influenza vaccine. All adults should be immunized every year.  Tetanus, diphtheria, and acellular pertussis (Td, Tdap) vaccine. Pregnant women should receive 1 dose of Tdap vaccine during each pregnancy. The dose should be obtained regardless of the length of time since the last dose. Immunization is preferred during the 27th-36th week of gestation. An adult who has not previously received Tdap or who does not know her vaccine status should receive 1 dose of Tdap. This initial dose should be followed by tetanus and diphtheria toxoids (Td) booster doses every 10 years. Adults with an unknown or incomplete history of completing a 3-dose immunization series with Td-containing vaccines should begin or complete a primary immunization series including a Tdap dose. Adults should receive a Td booster every 10 years.  Varicella vaccine. An adult without evidence of immunity to varicella should receive 2 doses or a second dose if she has previously received 1 dose. Pregnant females who do not have evidence of immunity should receive the first dose after pregnancy. This first dose should be obtained before leaving the health care facility. The second dose should be obtained 4-8 weeks after the first dose.  Human papillomavirus (HPV) vaccine. Females aged 13-26 years who have not received the vaccine previously should obtain the 3-dose series. The vaccine is not recommended for use in pregnant females. However, pregnancy testing is not needed before receiving a dose. If a female is found to be pregnant after receiving a dose, no treatment is needed. In that case, the remaining doses should be delayed until after the  pregnancy. Immunization is recommended for any person with an immunocompromised condition through the age of 76 years if she did not get any or all doses earlier. During the 3-dose series, the second dose should be obtained 4-8 weeks after the first dose. The third dose should be obtained 24 weeks after the first dose and 16 weeks after the  second dose.  Zoster vaccine. One dose is recommended for adults aged 72 years or older unless certain conditions are present.  Measles, mumps, and rubella (MMR) vaccine. Adults born before [birth year] generally are considered immune to measles and mumps. Adults born in [birth year] or later should have 1 or more doses of MMR vaccine unless there is a contraindication to the vaccine or there is laboratory evidence of immunity to each of the three diseases. A routine second dose of MMR vaccine should be obtained at least 28 days after the first dose for students attending postsecondary schools, health care workers, or international travelers. People who received inactivated measles vaccine or an unknown type of measles vaccine during 1963-1967 should receive 2 doses of MMR vaccine. People who received inactivated mumps vaccine or an unknown type of mumps vaccine before 1979 and are at high risk for mumps infection should consider immunization with 2 doses of MMR vaccine. For females of childbearing age, rubella immunity should be determined. If there is no evidence of immunity, females who are not pregnant should be vaccinated. If there is no evidence of immunity, females who are pregnant should delay immunization until after pregnancy. Unvaccinated health care workers born before [birth year] who lack laboratory evidence of measles, mumps, or rubella immunity or laboratory confirmation of disease should consider measles and mumps immunization with 2 doses of MMR vaccine or rubella immunization with 1 dose of MMR vaccine.  Pneumococcal 13-valent conjugate (PCV13) vaccine. When indicated, a person  who is uncertain of his immunization history and has no record of immunization should receive the PCV13 vaccine. All adults 67 years of age and older should receive this vaccine. An adult aged 17 years or older who has certain medical conditions and has not been previously immunized should receive 1 dose of PCV13 vaccine. This PCV13 should be followed with a dose of pneumococcal polysaccharide (PPSV23) vaccine. Adults who are at high risk for pneumococcal disease should obtain the PPSV23 vaccine at least 8 weeks after the dose of PCV13 vaccine. Adults older than 68 years of age who have normal immune system function should obtain the PPSV23 vaccine dose at least 1 year after the dose of PCV13 vaccine.  Pneumococcal polysaccharide (PPSV23) vaccine. When PCV13 is also indicated, PCV13 should be obtained first. All adults aged 93 years and older should be immunized. An adult younger than age 39 years who has certain medical conditions should be immunized. Any person who resides in a nursing home or long-term care facility should be immunized. An adult smoker should be immunized. People with an immunocompromised condition and certain other conditions should receive both PCV13 and PPSV23 vaccines. People with human immunodeficiency virus (HIV) infection should be immunized as soon as possible after diagnosis. Immunization during chemotherapy or radiation therapy should be avoided. Routine use of PPSV23 vaccine is not recommended for American Indians, [place] Natives, or people younger than 65 years unless there are medical conditions that require PPSV23 vaccine. When indicated, people who have unknown immunization and have no record of immunization should receive PPSV23 vaccine. One-time revaccination 5 years after the first dose of PPSV23 is recommended for people aged 19-64 years who have chronic kidney failure, nephrotic syndrome, asplenia, or immunocompromised conditions. People who received 1-2 doses of PPSV23  before age 7 years should receive another dose of PPSV23 vaccine at age 66 years or later if at least 5 years have passed since the previous dose. Doses of PPSV23 are not needed for people immunized with PPSV23 at or  after age 5 years.  Meningococcal vaccine. Adults with asplenia or persistent complement component deficiencies should receive 2 doses of quadrivalent meningococcal conjugate (MenACWY-D) vaccine. The doses should be obtained at least 2 months apart. Microbiologists working with certain meningococcal bacteria, [employer] recruits, people at risk during an outbreak, and people who travel to or live in countries with a high rate of meningitis should be immunized. A first-year college student up through age 43 years who is living in a residence hall should receive a dose if she did not receive a dose on or after her 16th birthday. Adults who have certain high-risk conditions should receive one or more doses of vaccine.  Hepatitis A vaccine. Adults who wish to be protected from this disease, have certain high-risk conditions, work with hepatitis A-infected animals, work in hepatitis A research labs, or travel to or work in countries with a high rate of hepatitis A should be immunized. Adults who were previously unvaccinated and who anticipate close contact with an international adoptee during the first 60 days after arrival in the [place] from a country with a high rate of hepatitis A should be immunized.  Hepatitis B vaccine. Adults who wish to be protected from this disease, have certain high-risk conditions, may be exposed to blood or other infectious body fluids, are household contacts or sex partners of hepatitis B positive people, are clients or workers in certain care facilities, or travel to or work in countries with a high rate of hepatitis B should be immunized.  Haemophilus influenzae type b (Hib) vaccine. A previously unvaccinated person with asplenia or sickle cell disease or  having a scheduled splenectomy should receive 1 dose of Hib vaccine. Regardless of previous immunization, a recipient of a hematopoietic stem cell transplant should receive a 3-dose series 6-12 months after her successful transplant. Hib vaccine is not recommended for adults with HIV infection. Preventive Services / Frequency Ages 3 to 36 years  Blood pressure check.** / Every 3-5 years.  Lipid and cholesterol check.** / Every 5 years beginning at age 4.  Clinical breast exam.** / Every 3 years for women in their 39s and 70s.  BRCA-related cancer risk assessment.** / For women who have family members with a BRCA-related cancer (breast, ovarian, tubal, or peritoneal cancers).  Pap test.** / Every 2 years from ages 59 through 92. Every 3 years starting at age 5 through age 22 or 10 with a history of 3 consecutive normal Pap tests.  HPV screening.** / Every 3 years from ages 37 through ages 81 to 83 with a history of 3 consecutive normal Pap tests.  Hepatitis C blood test.** / For any individual with known risks for hepatitis C.  Skin self-exam. / Monthly.  Influenza vaccine. / Every year.  Tetanus, diphtheria, and acellular pertussis (Tdap, Td) vaccine.** / Consult your health care provider. Pregnant women should receive 1 dose of Tdap vaccine during each pregnancy. 1 dose of Td every 10 years.  Varicella vaccine.** / Consult your health care provider. Pregnant females who do not have evidence of immunity should receive the first dose after pregnancy.  HPV vaccine. / 3 doses over 6 months, if 76 and younger. The vaccine is not recommended for use in pregnant females. However, pregnancy testing is not needed before receiving a dose.  Measles, mumps, rubella (MMR) vaccine.** / You need at least 1 dose of MMR if you were born in 1957 or later. You may also need a 2nd dose. For females of childbearing age,  rubella immunity should be determined. If there is no evidence of immunity, females  who are not pregnant should be vaccinated. If there is no evidence of immunity, females who are pregnant should delay immunization until after pregnancy.  Pneumococcal 13-valent conjugate (PCV13) vaccine.** / Consult your health care provider.  Pneumococcal polysaccharide (PPSV23) vaccine.** / 1 to 2 doses if you smoke cigarettes or if you have certain conditions.  Meningococcal vaccine.** / 1 dose if you are age 73 to 65 years and a Market researcher living in a residence hall, or have one of several medical conditions, you need to get vaccinated against meningococcal disease. You may also need additional booster doses.  Hepatitis A vaccine.** / Consult your health care provider.  Hepatitis B vaccine.** / Consult your health care provider.  Haemophilus influenzae type b (Hib) vaccine.** / Consult your health care provider. Ages 43 to 39 years  Blood pressure check.** / Every year.  Lipid and cholesterol check.** / Every 5 years beginning at age 64 years.  Lung cancer screening. / Every year if you are aged 42-80 years and have a 30-pack-year history of smoking and currently smoke or have quit within the past 15 years. Yearly screening is stopped once you have quit smoking for at least 15 years or develop a health problem that would prevent you from having lung cancer treatment.  Clinical breast exam.** / Every year after age 13 years.  BRCA-related cancer risk assessment.** / For women who have family members with a BRCA-related cancer (breast, ovarian, tubal, or peritoneal cancers).  Mammogram.** / Every year beginning at age 47 years and continuing for as long as you are in good health. Consult with your health care provider.  Pap test.** / Every 3 years starting at age 3 years through age 21 or 33 years with a history of 3 consecutive normal Pap tests.  HPV screening.** / Every 3 years from ages 51 years through ages 50 to 48 years with a history of 3 consecutive normal  Pap tests.  Fecal occult blood test (FOBT) of stool. / Every year beginning at age 4 years and continuing until age 44 years. You may not need to do this test if you get a colonoscopy every 10 years.  Flexible sigmoidoscopy or colonoscopy.** / Every 5 years for a flexible sigmoidoscopy or every 10 years for a colonoscopy beginning at age 14 years and continuing until age 32 years.  Hepatitis C blood test.** / For all people born from [birth year] through 1964 and any individual with known risks for hepatitis C.  Skin self-exam. / Monthly.  Influenza vaccine. / Every year.  Tetanus, diphtheria, and acellular pertussis (Tdap/Td) vaccine.** / Consult your health care provider. Pregnant women should receive 1 dose of Tdap vaccine during each pregnancy. 1 dose of Td every 10 years.  Varicella vaccine.** / Consult your health care provider. Pregnant females who do not have evidence of immunity should receive the first dose after pregnancy.  Zoster vaccine.** / 1 dose for adults aged 24 years or older.  Measles, mumps, rubella (MMR) vaccine.** / You need at least 1 dose of MMR if you were born in 1957 or later. You may also need a second dose. For females of childbearing age, rubella immunity should be determined. If there is no evidence of immunity, females who are not pregnant should be vaccinated. If there is no evidence of immunity, females who are pregnant should delay immunization until after pregnancy.  Pneumococcal 13-valent conjugate (PCV13) vaccine.** /  Consult your health care provider.  Pneumococcal polysaccharide (PPSV23) vaccine.** / 1 to 2 doses if you smoke cigarettes or if you have certain conditions.  Meningococcal vaccine.** / Consult your health care provider.  Hepatitis A vaccine.** / Consult your health care provider.  Hepatitis B vaccine.** / Consult your health care provider.  Haemophilus influenzae type b (Hib) vaccine.** / Consult your health care provider. Ages 75 years  and over  Blood pressure check.** / Every year.  Lipid and cholesterol check.** / Every 5 years beginning at age 54 years.  Lung cancer screening. / Every year if you are aged 28-80 years and have a 30-pack-year history of smoking and currently smoke or have quit within the past 15 years. Yearly screening is stopped once you have quit smoking for at least 15 years or develop a health problem that would prevent you from having lung cancer treatment.  Clinical breast exam.** / Every year after age 35 years.  BRCA-related cancer risk assessment.** / For women who have family members with a BRCA-related cancer (breast, ovarian, tubal, or peritoneal cancers).  Mammogram.** / Every year beginning at age 35 years and continuing for as long as you are in good health. Consult with your health care provider.  Pap test.** / Every 3 years starting at age 99 years through age 94 or 24 years with 3 consecutive normal Pap tests. Testing can be stopped between 65 and 70 years with 3 consecutive normal Pap tests and no abnormal Pap or HPV tests in the past 10 years.  HPV screening.** / Every 3 years from ages 59 years through ages 52 or 24 years with a history of 3 consecutive normal Pap tests. Testing can be stopped between 65 and 70 years with 3 consecutive normal Pap tests and no abnormal Pap or HPV tests in the past 10 years.  Fecal occult blood test (FOBT) of stool. / Every year beginning at age 32 years and continuing until age 84 years. You may not need to do this test if you get a colonoscopy every 10 years.  Flexible sigmoidoscopy or colonoscopy.** / Every 5 years for a flexible sigmoidoscopy or every 10 years for a colonoscopy beginning at age 35 years and continuing until age 39 years.  Hepatitis C blood test.** / For all people born from [birth year] through 1964 and any individual with known risks for hepatitis C.  Osteoporosis screening.** / A one-time screening for women ages 7 years and over and  women at risk for fractures or osteoporosis.  Skin self-exam. / Monthly.  Influenza vaccine. / Every year.  Tetanus, diphtheria, and acellular pertussis (Tdap/Td) vaccine.** / 1 dose of Td every 10 years.  Varicella vaccine.** / Consult your health care provider.  Zoster vaccine.** / 1 dose for adults aged 24 years or older.  Pneumococcal 13-valent conjugate (PCV13) vaccine.** / Consult your health care provider.  Pneumococcal polysaccharide (PPSV23) vaccine.** / 1 dose for all adults aged 89 years and older.  Meningococcal vaccine.** / Consult your health care provider.  Hepatitis A vaccine.** / Consult your health care provider.  Hepatitis B vaccine.** / Consult your health care provider.  Haemophilus influenzae type b (Hib) vaccine.** / Consult your health care provider. ** Family history and personal history of risk and conditions may change your health care provider's recommendations.   This information is not intended to replace advice given to you by your health care provider. Make sure you discuss any questions you have with your health care provider.  Document Released: 04/25/2001 Document Revised: 03/20/2014 Document Reviewed: 07/25/2010 Elsevier Interactive Patient Education [employer].

## 2015-02-16 NOTE — Progress Notes (Signed)
Subjective:    Patient ID: Denise Haas, female    DOB: Jun 01, 1945, 69 y.o.   MRN: [id number]  Chief Complaint  Patient presents with  . Annual Exam    HPI Patient is in today for new patient appointment. Her doctor is leaving this practice. She has been struggling with cough and congestion for several days now. She's been also struggling with fatigue, headaches and malaise. She notes postnasal drip productive of yellow phlegm. No fevers or chills. No other acute complaints. Denies CP/palp/SOB/HA/congestion/fevers/GI or GU c/o. Taking meds as prescribed  Past Medical History  Diagnosis Date  . Arthritis   . GERD (gastroesophageal reflux disease)   . Glaucoma   . History of colonic polyps   . History of chicken pox   . H/O measles   . H/O mumps   . Abnormal liver function test 02/20/2015  . Acute bronchitis 02/20/2015    Past Surgical History  Procedure Laterality Date  . Back surgery  1995    cage placed in lumbar  . Eye surgery      cataracts b/l  . Breast surgery      benign left breast biopsy for cyst    Family History  Problem Relation Age of Onset  . Cancer Mother     breast  . Heart disease Mother   . Diabetes Mother   . Asthma Mother   . Hyperlipidemia Mother   . Peripheral vascular disease Mother   . COPD Father   . Heart disease Father   . Stroke Maternal Grandmother   . Heart disease Maternal Grandfather   . Cancer Paternal Grandfather     throat  . Glaucoma Sister   . Glaucoma Brother   . Heart disease Brother   . Glaucoma Brother   . Cancer Brother     skin cancer, lost tip of thumb  . Glaucoma Brother   . Crohn's disease Brother     colostomy in place    Social History   Social History  . Marital Status: Married    Spouse Name: N/A  . Number of Children: N/A  . Years of Education: N/A   Occupational History  . Not on file.   Social History Main Topics  . Smoking status: Never Smoker   . Smokeless tobacco: Not on file  .  Alcohol Use: Yes  . Drug Use: No  . 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 Not on file   Social History Narrative    Outpatient Prescriptions Prior to Visit  Medication Sig Dispense Refill  . Biotin 300 MCG TABS Take 1 tablet by mouth daily.    . Calcium Carb-Cholecalciferol (CALCIUM + D3) 600-200 MG-UNIT TABS Take 1 tablet by mouth 2 (two) times daily.    . Cholecalciferol (VITAMIN D3) 2000 UNITS TABS Take 1 tablet by mouth daily.    . Cyanocobalamin (B-12) 2500 MCG TABS Take 1 tablet by mouth daily.    Marland Kitchen latanoprost (XALATAN) 0.005 % ophthalmic solution     . Melatonin 2.5 MG CAPS Take 1 capsule by mouth Nightly.    . Polyvinyl Alcohol-Povidone (REFRESH OP) Apply to eye.    . Probiotic Product (PROBIOTIC DAILY PO) Take by mouth.    . traMADol (ULTRAM) 50 MG tablet Take 1 tablet (50 mg total) by mouth every 12 (twelve) hours as needed. 90 tablet 0  . tretinoin (  RETIN-A) 0.025 % cream Apply topically at bedtime. 45 g 3  . Estradiol (VAGIFEM) 10 MCG TABS vaginal tablet Place 1 tablet (10 mcg total) vaginally once a week. (Patient not taking: Reported on 02/15/2015) 12 tablet 3   No facility-administered medications prior to visit.    Allergies  Allergen Reactions  . Amoxicillin Hives  . Aspirin Hives  . Erythromycin Hives and Rash  . Ibuprofen Hives  . Latex Rash  . Tetracyclines & Related Hives  . Triple Antibiotic [Bacitracin-Neomycin-Polymyxin] Rash    Review of Systems  Constitutional: Positive for malaise/fatigue. Negative for fever.  HENT: Positive for congestion and sore throat.   Eyes: Negative for discharge.  Respiratory: Positive for cough and sputum production. Negative for shortness of breath.   Cardiovascular: Negative for chest pain, palpitations and leg swelling.  Gastrointestinal: Negative for nausea and abdominal pain.  Genitourinary: Negative for dysuria.   Musculoskeletal: Negative for falls.  Skin: Negative for rash.  Neurological: Positive for headaches. Negative for loss of consciousness.  Endo/Heme/Allergies: Negative for environmental allergies.  Psychiatric/Behavioral: Negative for depression. The patient is not nervous/anxious.        Objective:    Physical Exam  Constitutional: She is oriented to person, place, and time. She appears well-developed and well-nourished. No distress.  HENT:  Head: Normocephalic and atraumatic.  Eyes: Conjunctivae are normal.  Neck: Neck supple. No thyromegaly present.  Cardiovascular: Normal rate, regular rhythm and normal heart sounds.   No murmur heard. Pulmonary/Chest: Effort normal and breath sounds normal. No respiratory distress.  Abdominal: Soft. Bowel sounds are normal. She exhibits no distension and no mass. There is no tenderness.  Musculoskeletal: She exhibits no edema.  Lymphadenopathy:    She has no cervical adenopathy.  Neurological: She is alert and oriented to person, place, and time.  Skin: Skin is warm and dry.  Psychiatric: She has a normal mood and affect. Her behavior is normal.    BP 134/74 mmHg  Pulse 100  Temp(Src) 98.7 F (37.1 C) (Oral)  Ht 5\' 10"  (1.778 m)  Wt 218 lb 4 oz (98.998 kg)  BMI 31.32 kg/m2  SpO2 97% Wt Readings from Last 3 Encounters:  02/16/15 218 lb 4 oz (98.998 kg)  11/19/14 216 lb 4 oz (98.09 kg)  08/25/14 215 lb 12.8 oz (97.886 kg)     Lab Results  Component Value Date   WBC 5.8 02/16/2015   HGB 12.7 02/16/2015   HCT 39.5 02/16/2015   PLT 294 02/16/2015   GLUCOSE 98 02/16/2015   CHOL 187 02/16/2015   TRIG 169* 02/16/2015   HDL 59 02/16/2015   LDLCALC 94 02/16/2015   ALT 32* 02/16/2015   AST 32 02/16/2015   NA 141 02/16/2015   K 4.7 02/16/2015   CL 106 02/16/2015   CREATININE 0.93 02/16/2015   BUN 11 02/16/2015   CO2 30 02/16/2015   TSH 1.883 02/16/2015    Lab Results  Component Value Date   TSH 1.883 02/16/2015   Lab  Results  Component Value Date   WBC 5.8 02/16/2015   HGB 12.7 02/16/2015   HCT 39.5 02/16/2015   MCV 96.1 02/16/2015   PLT 294 02/16/2015   Lab Results  Component Value Date   NA 141 02/16/2015   K 4.7 02/16/2015   CO2 30 02/16/2015   GLUCOSE 98 02/16/2015   BUN 11 02/16/2015   CREATININE 0.93 02/16/2015   BILITOT 0.4 02/16/2015   ALKPHOS 49 02/16/2015   AST 32 02/16/2015  ALT 32* 02/16/2015   PROT 6.6 02/16/2015   ALBUMIN 3.9 02/16/2015   CALCIUM 9.2 02/16/2015   Lab Results  Component Value Date   CHOL 187 02/16/2015   Lab Results  Component Value Date   HDL 59 02/16/2015   Lab Results  Component Value Date   LDLCALC 94 02/16/2015   Lab Results  Component Value Date   TRIG 169* 02/16/2015   Lab Results  Component Value Date   CHOLHDL 3.2 02/16/2015   No results found for: HGBA1C     Assessment & Plan:   Problem List Items Addressed This Visit    Abnormal liver function test    Mild, minimize simple carbohydrates      Acute bronchitis    Started on Ciprofloxacin, Mucinex and probiotics      Cervical radiculopathy due to degenerative joint disease of spine    Encouraged moist heat and gentle stretching as tolerated. May try NSAIDs and prescription meds as directed and report if symptoms worsen or seek immediate care      Hyperlipidemia, mixed    Encouraged heart healthy diet, increase exercise, avoid trans fats, consider a krill oil cap daily       Left shoulder pain - Primary    Rescheduled MRI due to cough, still achy is going to schedule MRI for 12/18 encouraged to proceed. Has an       Overweight    Encouraged DASH diet, decrease po intake and increase exercise as tolerated. Needs 7-8 hours of sleep nightly. Avoid trans fats, eat small, frequent meals every 4-5 hours with lean proteins, complex carbs and healthy fats. Minimize simple carbs       Other Visit Diagnoses    Need for vaccination with 13-polyvalent pneumococcal conjugate  vaccine        Relevant Orders    Pneumococcal conjugate vaccine 13-valent (Completed)       I have changed Ms. Foerster's ciprofloxacin. I am also having her maintain her latanoprost, Vitamin D3, Calcium + D3, Melatonin, B-12, Biotin, Probiotic Product (PROBIOTIC DAILY PO), Polyvinyl Alcohol-Povidone (REFRESH OP), Estradiol, tretinoin, and traMADol.  Meds ordered this encounter  Medications  . DISCONTD: ciprofloxacin (CIPRO) 250 MG tablet    Sig: Take 250 mg by mouth 2 (two) times daily. Take for 7 days  . ciprofloxacin (CIPRO) 250 MG tablet    Sig: Take 1 tablet (250 mg total) by mouth 2 (two) times daily. Take for 7 days    Dispense:  14 tablet    Refill:  0     Penni Homans, MD

## 2015-02-16 NOTE — Progress Notes (Signed)
Pre visit review using our clinic review tool, if applicable. No additional management support is needed unless otherwise documented below in the visit note. 

## 2015-02-16 NOTE — Assessment & Plan Note (Signed)
Rescheduled MRI due to cough, still achy is going to schedule MRI for 12/18 encouraged to proceed. Has an

## 2015-02-17 LAB — TSH: TSH: 1.883 u[IU]/mL (ref 0.350–4.500)

## 2015-02-18 ENCOUNTER — Encounter: Payer: Self-pay | Admitting: Family Medicine

## 2015-02-20 ENCOUNTER — Encounter: Payer: Self-pay | Admitting: Family Medicine

## 2015-02-20 DIAGNOSIS — R945 Abnormal results of liver function studies: Secondary | ICD-10-CM

## 2015-02-20 DIAGNOSIS — R7989 Other specified abnormal findings of blood chemistry: Secondary | ICD-10-CM

## 2015-02-20 DIAGNOSIS — J209 Acute bronchitis, unspecified: Secondary | ICD-10-CM

## 2015-02-20 DIAGNOSIS — E782 Mixed hyperlipidemia: Secondary | ICD-10-CM | POA: Insufficient documentation

## 2015-02-20 HISTORY — DX: Abnormal results of liver function studies: R94.5

## 2015-02-20 HISTORY — DX: Other specified abnormal findings of blood chemistry: R79.89

## 2015-02-20 HISTORY — DX: Acute bronchitis, unspecified: J20.9

## 2015-02-20 NOTE — Assessment & Plan Note (Signed)
Encouraged moist heat and gentle stretching as tolerated. May try NSAIDs and prescription meds as directed and report if symptoms worsen or seek immediate care 

## 2015-02-20 NOTE — Assessment & Plan Note (Signed)
Encouraged heart healthy diet, increase exercise, avoid trans fats, consider a krill oil cap daily 

## 2015-02-20 NOTE — Assessment & Plan Note (Signed)
Started on Ciprofloxacin, Mucinex and probiotics

## 2015-02-20 NOTE — Assessment & Plan Note (Signed)
Mild, minimize simple carbohydrates

## 2015-02-20 NOTE — Assessment & Plan Note (Signed)
Encouraged DASH diet, decrease po intake and increase exercise as tolerated. Needs 7-8 hours of sleep nightly. Avoid trans fats, eat small, frequent meals every 4-5 hours with lean proteins, complex carbs and healthy fats. Minimize simple carbs 

## 2015-02-25 ENCOUNTER — Telehealth: Payer: Self-pay | Admitting: Family Medicine

## 2015-02-25 NOTE — Telephone Encounter (Signed)
Caller name: Self   Can be reached: [phone]  Pharmacy:  [place], [place] - [place] [phone] (Phone) [phone] (Fax)         Reason for call: Patient is having MRI done on Sunday 12/18 and is claustrophobic, she is requesting something to take prior to having it done to be relaxed. States her husband will be driving

## 2015-02-26 MED ORDER — LORAZEPAM 0.5 MG PO TABS: ORAL_TABLET | ORAL | Status: DC

## 2015-02-26 NOTE — Telephone Encounter (Signed)
OK to send her in Lorazepam 0.5 mg tabs 1 tab 1 hour prior to procedure and may take one at procedure prn if she has a driver. Disp #2

## 2015-02-26 NOTE — Telephone Encounter (Signed)
Printed and faxed to [facility]	[place]. Patient informed

## 2015-02-26 NOTE — Telephone Encounter (Signed)
Printed and on counter for signature. 

## 2015-03-01 DIAGNOSIS — M75112 Incomplete rotator cuff tear or rupture of left shoulder, not specified as traumatic: Secondary | ICD-10-CM | POA: Insufficient documentation

## 2015-03-05 ENCOUNTER — Encounter: Payer: Self-pay | Admitting: Gastroenterology

## 2015-05-06 ENCOUNTER — Encounter: Payer: Medicare Other | Admitting: Gastroenterology

## 2015-05-08 ENCOUNTER — Telehealth: Payer: Self-pay | Admitting: Family Medicine

## 2015-05-13 ENCOUNTER — Telehealth: Payer: Self-pay | Admitting: Family Medicine

## 2015-05-13 NOTE — Telephone Encounter (Signed)
Pt is requesting refill on Tramadol.  Last OV: 02/16/2015 Last Fill: 01/25/2015 #90 and 0RF  Please advise.

## 2015-05-13 NOTE — Telephone Encounter (Signed)
OK to refill Tramadol, same sig, same number.  

## 2015-05-14 MED ORDER — TRAMADOL HCL 50 MG PO TABS: 50.0000 mg | ORAL_TABLET | Freq: Two times a day (BID) | ORAL | Status: DC | PRN

## 2015-05-14 NOTE — Addendum Note (Signed)
Addended by: Sharon Seller B on: 05/14/2015 03:06 PM   Modules accepted: Orders

## 2015-05-14 NOTE — Telephone Encounter (Signed)
Faxed hardcopy for Tramadol to [facility].

## 2015-06-22 MED ORDER — TRAMADOL HCL 50 MG PO TABS: 50.0000 mg | ORAL_TABLET | Freq: Two times a day (BID) | ORAL | Status: DC | PRN

## 2015-06-22 NOTE — Telephone Encounter (Signed)
Rx printed, awaiting DO signature.  

## 2015-06-22 NOTE — Addendum Note (Signed)
Addended byDamita Dunnings D on: 06/22/2015 01:16 PM   Modules accepted: Orders

## 2015-06-22 NOTE — Telephone Encounter (Signed)
Refill x1 only 

## 2015-06-22 NOTE — Telephone Encounter (Signed)
Rx faxed to [facility] pharmacy.  

## 2015-06-22 NOTE — Telephone Encounter (Signed)
Pt is requesting refill on Tramadol. Dr. Frederik Pear Pt.  Last OV: 02/16/2015 Last Fill: 05/14/2015 #90 and 0RF Pt sig: 1 tablet q12h PRN UDS: None  Please advise.

## 2015-08-30 ENCOUNTER — Encounter: Payer: Self-pay | Admitting: Family Medicine

## 2015-08-30 ENCOUNTER — Ambulatory Visit (INDEPENDENT_AMBULATORY_CARE_PROVIDER_SITE_OTHER): Payer: No Typology Code available for payment source | Admitting: Family Medicine

## 2015-08-30 VITALS — BP 110/72 | HR 94 | Temp 98.7°F | Ht 70.0 in | Wt 216.4 lb

## 2015-08-30 DIAGNOSIS — M25512 Pain in left shoulder: Secondary | ICD-10-CM

## 2015-08-30 DIAGNOSIS — E663 Overweight: Secondary | ICD-10-CM

## 2015-08-30 DIAGNOSIS — E782 Mixed hyperlipidemia: Secondary | ICD-10-CM | POA: Diagnosis not present

## 2015-08-30 MED ORDER — TRETINOIN 0.025 % EX CREA: TOPICAL_CREAM | Freq: Every day | CUTANEOUS | Status: DC

## 2015-08-30 NOTE — Assessment & Plan Note (Addendum)
Slight weight loss, Encouraged DASH diet, decrease po intake and increase exercise as tolerated. Needs 7-8 hours of sleep nightly. Avoid trans fats, eat small, frequent meals every 4-5 hours with lean proteins, complex carbs and healthy fats. Minimize simple carbs

## 2015-08-30 NOTE — Progress Notes (Signed)
Pre visit review using our clinic review tool, if applicable. No additional management support is needed unless otherwise documented below in the visit note. 

## 2015-08-30 NOTE — Patient Instructions (Signed)

## 2015-09-05 NOTE — Assessment & Plan Note (Signed)
Encouraged moist heat and gentle stretching as tolerated. May try NSAIDs and prescription meds as directed and report if symptoms worsen or seek immediate care 

## 2015-09-05 NOTE — Assessment & Plan Note (Signed)
Encouraged heart healthy diet, increase exercise, avoid trans fats, consider a krill oil cap daily 

## 2015-09-05 NOTE — Progress Notes (Signed)
Patient ID: Denise Haas, female   DOB: 11-30-1945, 70 y.o.   MRN: [id number]   Subjective:    Patient ID: Denise Haas, female    DOB: 1945/08/02, 70 y.o.   MRN: [id number]  Chief Complaint  Patient presents with  . Follow-up    HPI Patient is in today for follow up. Is doing well today. Notes occasional discharge from right ear but no pain. Notes some intermittent left shoulder pain, follows with orthopaedics and some steroid injections can be helpful. Denies CP/palp/SOB/HA/congestion/fevers/GI or GU c/o. Taking meds as prescribed  Past Medical History  Diagnosis Date  . Arthritis   . GERD (gastroesophageal reflux disease)   . Glaucoma   . History of colonic polyps   . History of chicken pox   . H/O measles   . H/O mumps   . Abnormal liver function test 02/20/2015  . Acute bronchitis 02/20/2015    Past Surgical History  Procedure Laterality Date  . Back surgery  1995    cage placed in lumbar  . Eye surgery      cataracts b/l  . Breast surgery      benign left breast biopsy for cyst    Family History  Problem Relation Age of Onset  . Cancer Mother     breast  . Heart disease Mother   . Diabetes Mother   . Asthma Mother   . Hyperlipidemia Mother   . Peripheral vascular disease Mother   . COPD Father   . Heart disease Father   . Stroke Maternal Grandmother   . Heart disease Maternal Grandfather   . Cancer Paternal Grandfather     throat  . Glaucoma Sister   . Glaucoma Brother   . Heart disease Brother   . Glaucoma Brother   . Cancer Brother     skin cancer, lost tip of thumb  . Glaucoma Brother   . Crohn's disease Brother     colostomy in place    Social History   Social History  . Marital Status: Married    Spouse Name: N/A  . Number of Children: N/A  . Years of Education: N/A   Occupational History  . Not on file.   Social History Main Topics  . Smoking status: Never Smoker   . Smokeless tobacco: Not on file  . Alcohol Use: Yes  . Drug  Use: No  . 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 Not on file   Social History Narrative    Outpatient Prescriptions Prior to Visit  Medication Sig Dispense Refill  . Biotin 300 MCG TABS Take 1 tablet by mouth daily.    . Calcium Carb-Cholecalciferol (CALCIUM + D3) 600-200 MG-UNIT TABS Take 1 tablet by mouth 2 (two) times daily.    . Cholecalciferol (VITAMIN D3) 2000 UNITS TABS Take 1 tablet by mouth daily.    . Cyanocobalamin (B-12) 2500 MCG TABS Take 1 tablet by mouth daily.    Marland Kitchen latanoprost (XALATAN) 0.005 % ophthalmic solution     . Melatonin 2.5 MG CAPS Take 1 capsule by mouth Nightly.    . Polyvinyl Alcohol-Povidone (REFRESH OP) Apply to eye.    . Probiotic Product (PROBIOTIC DAILY PO) Take by mouth.    . traMADol (ULTRAM) 50 MG tablet Take 1 tablet (50 mg total) by mouth every 12 (twelve) hours as needed. 90 tablet 0  .  tretinoin (RETIN-A) 0.025 % cream Apply topically at bedtime. 45 g 3  . ciprofloxacin (CIPRO) 250 MG tablet Take 1 tablet (250 mg total) by mouth 2 (two) times daily. Take for 7 days 14 tablet 0  . Estradiol (VAGIFEM) 10 MCG TABS vaginal tablet Place 1 tablet (10 mcg total) vaginally once a week. (Patient not taking: Reported on 02/15/2015) 12 tablet 3  . LORazepam (ATIVAN) 0.5 MG tablet Take 1 by mouth one hour prior to procedure. 1 tablet 0   No facility-administered medications prior to visit.    Allergies  Allergen Reactions  . Amoxicillin Hives  . Aspirin Hives  . Erythromycin Hives and Rash  . Ibuprofen Hives  . Latex Rash  . Tetracyclines & Related Hives  . Triple Antibiotic [Bacitracin-Neomycin-Polymyxin] Rash    Review of Systems  Constitutional: Negative for fever and malaise/fatigue.  HENT: Negative for congestion.   Eyes: Negative for blurred vision.  Respiratory: Negative for shortness of breath.   Cardiovascular: Negative for chest  pain, palpitations and leg swelling.  Gastrointestinal: Negative for nausea, abdominal pain and blood in stool.  Genitourinary: Negative for dysuria and frequency.  Musculoskeletal: Positive for back pain and joint pain. Negative for falls.  Skin: Negative for rash.  Neurological: Negative for dizziness, loss of consciousness and headaches.  Endo/Heme/Allergies: Negative for environmental allergies.  Psychiatric/Behavioral: Negative for depression. The patient is not nervous/anxious.        Objective:    Physical Exam  Constitutional: She is oriented to person, place, and time. She appears well-developed and well-nourished. No distress.  HENT:  Head: Normocephalic and atraumatic.  Nose: Nose normal.  Eyes: Right eye exhibits no discharge. Left eye exhibits no discharge.  Neck: Normal range of motion. Neck supple.  Cardiovascular: Normal rate and regular rhythm.   No murmur heard. Pulmonary/Chest: Effort normal and breath sounds normal.  Abdominal: Soft. Bowel sounds are normal. There is no tenderness.  Musculoskeletal: She exhibits no edema.  Neurological: She is alert and oriented to person, place, and time.  Skin: Skin is warm and dry.  Psychiatric: She has a normal mood and affect.  Nursing note and vitals reviewed.   BP 110/72 mmHg  Pulse 94  Temp(Src) 98.7 F (37.1 C) (Oral)  Ht 5\' 10"  (1.778 m)  Wt 216 lb 6 oz (98.147 kg)  BMI 31.05 kg/m2  SpO2 96% Wt Readings from Last 3 Encounters:  08/30/15 216 lb 6 oz (98.147 kg)  02/16/15 218 lb 4 oz (98.998 kg)  11/19/14 216 lb 4 oz (98.09 kg)     Lab Results  Component Value Date   WBC 5.8 02/16/2015   HGB 12.7 02/16/2015   HCT 39.5 02/16/2015   PLT 294 02/16/2015   GLUCOSE 98 02/16/2015   CHOL 187 02/16/2015   TRIG 169* 02/16/2015   HDL 59 02/16/2015   LDLCALC 94 02/16/2015   ALT 32* 02/16/2015   AST 32 02/16/2015   NA 141 02/16/2015   K 4.7 02/16/2015   CL 106 02/16/2015   CREATININE 0.93 02/16/2015   BUN  11 02/16/2015   CO2 30 02/16/2015   TSH 1.883 02/16/2015    Lab Results  Component Value Date   TSH 1.883 02/16/2015   Lab Results  Component Value Date   WBC 5.8 02/16/2015   HGB 12.7 02/16/2015   HCT 39.5 02/16/2015   MCV 96.1 02/16/2015   PLT 294 02/16/2015   Lab Results  Component Value Date   NA 141 02/16/2015   K  4.7 02/16/2015   CO2 30 02/16/2015   GLUCOSE 98 02/16/2015   BUN 11 02/16/2015   CREATININE 0.93 02/16/2015   BILITOT 0.4 02/16/2015   ALKPHOS 49 02/16/2015   AST 32 02/16/2015   ALT 32* 02/16/2015   PROT 6.6 02/16/2015   ALBUMIN 3.9 02/16/2015   CALCIUM 9.2 02/16/2015   Lab Results  Component Value Date   CHOL 187 02/16/2015   Lab Results  Component Value Date   HDL 59 02/16/2015   Lab Results  Component Value Date   LDLCALC 94 02/16/2015   Lab Results  Component Value Date   TRIG 169* 02/16/2015   Lab Results  Component Value Date   CHOLHDL 3.2 02/16/2015   No results found for: HGBA1C     Assessment & Plan:   Problem List Items Addressed This Visit    Overweight - Primary    Slight weight loss, Encouraged DASH diet, decrease po intake and increase exercise as tolerated. Needs 7-8 hours of sleep nightly. Avoid trans fats, eat small, frequent meals every 4-5 hours with lean proteins, complex carbs and healthy fats. Minimize simple carbs      Left shoulder pain    Encouraged moist heat and gentle stretching as tolerated. May try NSAIDs and prescription meds as directed and report if symptoms worsen or seek immediate care      Hyperlipidemia, mixed    Encouraged heart healthy diet, increase exercise, avoid trans fats, consider a krill oil cap daily         I have discontinued Denise Haas's Estradiol, ciprofloxacin, and LORazepam. I am also having her maintain her latanoprost, Vitamin D3, Calcium + D3, Melatonin, B-12, Biotin, Probiotic Product (PROBIOTIC DAILY PO), Polyvinyl Alcohol-Povidone (REFRESH OP), traMADol, and  tretinoin.  Meds ordered this encounter  Medications  . tretinoin (RETIN-A) 0.025 % cream    Sig: Apply topically at bedtime.    Dispense:  45 g    Refill:  3     Penni Homans, MD

## 2015-10-05 ENCOUNTER — Telehealth: Payer: Self-pay

## 2015-10-05 NOTE — Telephone Encounter (Signed)
Received PA approval effective 10/05/2015 through 10/05/2018. PA approval faxed to [place] [facility] and sent for scanning.

## 2015-10-05 NOTE — Telephone Encounter (Signed)
Received PA form for Retin-A/Tretinoin cream 0.025 % from [facility]. Form completed, stamped w/ PCP signature in absence and faxed back to [place] successfully at [phone]. Form sent for scanning. Awaiting determination.

## 2015-11-22 ENCOUNTER — Other Ambulatory Visit: Payer: Self-pay | Admitting: Family Medicine

## 2015-11-22 MED ORDER — TRETINOIN 0.025 % EX CREA: TOPICAL_CREAM | Freq: Every day | CUTANEOUS | 1 refills | Status: DC

## 2015-11-27 ENCOUNTER — Other Ambulatory Visit: Payer: Self-pay | Admitting: Family Medicine

## 2015-11-29 ENCOUNTER — Other Ambulatory Visit: Payer: Self-pay | Admitting: Family Medicine

## 2015-11-29 MED ORDER — TRAMADOL HCL 50 MG PO TABS: 50.0000 mg | ORAL_TABLET | Freq: Two times a day (BID) | ORAL | 0 refills | Status: DC | PRN

## 2015-11-29 NOTE — Telephone Encounter (Signed)
Faxed hardcopy for Tramadol To HT at [facility]

## 2016-02-14 ENCOUNTER — Other Ambulatory Visit: Payer: Self-pay | Admitting: Family Medicine

## 2016-02-18 ENCOUNTER — Telehealth: Payer: Self-pay | Admitting: Family Medicine

## 2016-02-18 NOTE — Telephone Encounter (Signed)
Yes we can do that just addend the reason for visit so we remember she needs this

## 2016-02-18 NOTE — Telephone Encounter (Signed)
Ok

## 2016-02-18 NOTE — Telephone Encounter (Signed)
Patient would like to have pelvic exam at [facility] wants to know are you able to do that for her. Please advise

## 2016-02-21 ENCOUNTER — Ambulatory Visit (INDEPENDENT_AMBULATORY_CARE_PROVIDER_SITE_OTHER): Payer: No Typology Code available for payment source | Admitting: Family Medicine

## 2016-02-21 ENCOUNTER — Encounter: Payer: Self-pay | Admitting: Family Medicine

## 2016-02-21 VITALS — BP 132/72 | HR 79 | Temp 98.3°F | Wt 220.2 lb

## 2016-02-21 DIAGNOSIS — Z860100 Personal history of colon polyps, unspecified: Secondary | ICD-10-CM

## 2016-02-21 DIAGNOSIS — M4722 Other spondylosis with radiculopathy, cervical region: Secondary | ICD-10-CM | POA: Diagnosis not present

## 2016-02-21 DIAGNOSIS — Z0000 Encounter for general adult medical examination without abnormal findings: Secondary | ICD-10-CM

## 2016-02-21 DIAGNOSIS — Z1211 Encounter for screening for malignant neoplasm of colon: Secondary | ICD-10-CM | POA: Diagnosis not present

## 2016-02-21 DIAGNOSIS — E663 Overweight: Secondary | ICD-10-CM

## 2016-02-21 HISTORY — DX: Personal history of colonic polyps: Z86.010

## 2016-02-21 HISTORY — DX: Personal history of colon polyps, unspecified: Z86.0100

## 2016-02-21 MED ORDER — TRAMADOL HCL 50 MG PO TABS: 50.0000 mg | ORAL_TABLET | Freq: Two times a day (BID) | ORAL | 0 refills | Status: DC | PRN

## 2016-02-21 NOTE — Assessment & Plan Note (Signed)
Uses Tramadol sparingly for severe pain in neck and low back, given refill today

## 2016-02-21 NOTE — Progress Notes (Signed)
Pre visit review using our clinic review tool, if applicable. No additional management support is needed unless otherwise documented below in the visit note. 

## 2016-02-21 NOTE — Patient Instructions (Addendum)
Send email in February asking about Dr Migdalia Dk weight loss program if you are still interested.   [email or web address] for updates    Preventive Care 33 Years and Older, Female Preventive care refers to lifestyle choices and visits with your health care provider that can promote health and wellness. What does preventive care include?  A yearly physical exam. This is also called an annual well check.  Dental exams once or twice a year.  Routine eye exams. Ask your health care provider how often you should have your eyes checked.  Personal lifestyle choices, including:  Daily care of your teeth and gums.  Regular physical activity.  Eating a healthy diet.  Avoiding tobacco and drug use.  Limiting alcohol use.  Practicing safe sex.  Taking low-dose aspirin every day.  Taking vitamin and mineral supplements as recommended by your health care provider. What happens during an annual well check? The services and screenings done by your health care provider during your annual well check will depend on your age, overall health, lifestyle risk factors, and family history of disease. Counseling  Your health care provider may ask you questions about your:  Alcohol use.  Tobacco use.  Drug use.  Emotional well-being.  Home and relationship well-being.  Sexual activity.  Eating habits.  History of falls.  Memory and ability to understand (cognition).  Work and work Statistician.  Reproductive health. Screening  You may have the following tests or measurements:  Height, weight, and BMI.  Blood pressure.  Lipid and cholesterol levels. These may be checked every 5 years, or more frequently if you are over 81 years old.  Skin check.  Lung cancer screening. You may have this screening every year starting at age 61 if you have a 30-pack-year history of smoking and currently smoke or have quit within the past 15 years.  Fecal occult blood test (FOBT) of the stool. You  may have this test every year starting at age 30.  Flexible sigmoidoscopy or colonoscopy. You may have a sigmoidoscopy every 5 years or a colonoscopy every 10 years starting at age 41.  Hepatitis C blood test.  Hepatitis B blood test.  Sexually transmitted disease (STD) testing.  Diabetes screening. This is done by checking your blood sugar (glucose) after you have not eaten for a while (fasting). You may have this done every 1-3 years.  Bone density scan. This is done to screen for osteoporosis. You may have this done starting at age 14.  Mammogram. This may be done every 1-2 years. Talk to your health care provider about how often you should have regular mammograms. Talk with your health care provider about your test results, treatment options, and if necessary, the need for more tests. Vaccines  Your health care provider may recommend certain vaccines, such as:  Influenza vaccine. This is recommended every year.  Tetanus, diphtheria, and acellular pertussis (Tdap, Td) vaccine. You may need a Td booster every 10 years.  Varicella vaccine. You may need this if you have not been vaccinated.  Zoster vaccine. You may need this after age 52.  Measles, mumps, and rubella (MMR) vaccine. You may need at least one dose of MMR if you were born in 1957 or later. You may also need a second dose.  Pneumococcal 13-valent conjugate (PCV13) vaccine. One dose is recommended after age 63.  Pneumococcal polysaccharide (PPSV23) vaccine. One dose is recommended after age 52.  Meningococcal vaccine. You may need this if you have certain conditions.  Hepatitis A  vaccine. You may need this if you have certain conditions or if you travel or work in places where you may be exposed to hepatitis A.  Hepatitis B vaccine. You may need this if you have certain conditions or if you travel or work in places where you may be exposed to hepatitis B.  Haemophilus influenzae type b (Hib) vaccine. You may need  this if you have certain conditions. Talk to your health care provider about which screenings and vaccines you need and how often you need them. This information is not intended to replace advice given to you by your health care provider. Make sure you discuss any questions you have with your health care provider. Document Released: 03/26/2015 Document Revised: 11/17/2015 Document Reviewed: 12/29/2014 Elsevier Interactive Patient Education  2017 [employer].

## 2016-02-21 NOTE — Assessment & Plan Note (Signed)
Encouraged DASH diet, decrease po intake and increase exercise as tolerated. Needs 7-8 hours of sleep nightly. Avoid trans fats, eat small, frequent meals every 4-5 hours with lean proteins, complex carbs and healthy fats. Minimize simple carbs. She is interested in pursuing bariatric consultation when available. She will email [place] early next year for update.

## 2016-02-21 NOTE — Assessment & Plan Note (Addendum)
Patient encouraged to maintain heart healthy diet, regular exercise, adequate sleep. Consider daily probiotics. Take medications as prescribed. Patient has ACP but takes a copy for further consideration. Sees GYN and has a MGM scheduled for Feb

## 2016-02-21 NOTE — Progress Notes (Signed)
Patient ID: Denise Haas, female   DOB: 08-28-1945, 70 y.o.   MRN: [id number]   Subjective:    Patient ID: Denise Haas, female    DOB: 1945-08-11, 70 y.o.   MRN: [id number]  Chief Complaint  Patient presents with  . Annual Exam    Noconcernsnoted.    HPI Patient is in today for annual preventative exam and follow up on medical condition. She feels well today and denies any recent hospitalizations. She is frustrated with her weight gain and also frustrated with irritated seborrhea which sometimes bleed on her back. She acknowledges she does not exercise regularly or eat a heart healthy diet consistently. Has an appt with GYN for MGM in 2/18. Denies CP/palp/SOB/HA/congestion/fevers/GI or GU c/o. Taking meds as prescribed  Past Medical History:  Diagnosis Date  . Abnormal liver function test 02/20/2015  . Acne   . Acute bronchitis 02/20/2015  . Arthritis   . GERD (gastroesophageal reflux disease)   . Glaucoma   . H/O measles   . H/O mumps   . History of chicken pox   . History of colonic polyps   . History of colonic polyps 02/21/2016    Past Surgical History:  Procedure Laterality Date  . [place]   cage placed in lumbar  . BREAST SURGERY     benign left breast biopsy for cyst  . EYE SURGERY     cataracts b/l    Family History  Problem Relation Age of Onset  . Cancer Mother     breast  . Heart disease Mother   . Diabetes Mother   . Asthma Mother   . Hyperlipidemia Mother   . Peripheral vascular disease Mother   . COPD Father   . Heart disease Father   . Stroke Maternal Grandmother   . Heart disease Maternal Grandfather   . Cancer Paternal Grandfather     throat  . Glaucoma Sister   . Glaucoma Brother   . Heart disease Brother   . Glaucoma Brother   . Cancer Brother     skin cancer, lost tip of thumb  . Glaucoma Brother   . Crohn's disease Brother     colostomy in place  . Cancer Cousin     colon     Social History   Social History  .  Marital status: Married    Spouse name: N/A  . Number of children: N/A  . Years of education: N/A   Occupational History  . Not on file.   Social History Main Topics  . Smoking status: Never Smoker  . Smokeless tobacco: Not on file  . Alcohol use Yes  . Drug use: No  . 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 no dietary restrictions. wears seat belt   Other Topics Concern  . Not on file   Social History Narrative  . No narrative on file    Outpatient Medications Prior to Visit  Medication Sig Dispense Refill  . Biotin 300 MCG TABS Take 1 tablet by mouth daily.    . Calcium Carb-Cholecalciferol (CALCIUM + D3) 600-200 MG-UNIT TABS Take 1 tablet by mouth 2 (two) times daily.    . Cholecalciferol (VITAMIN D3) 2000 UNITS TABS Take 1 tablet by mouth daily.    . Cyanocobalamin (B-12) 2500 MCG TABS Take 1 tablet by mouth daily.    Marland Kitchen latanoprost (XALATAN) 0.005 % ophthalmic solution     . Melatonin 2.5 MG CAPS  Take 1 capsule by mouth Nightly.    . Polyvinyl Alcohol-Povidone (REFRESH OP) Apply to eye.    . Probiotic Product (PROBIOTIC DAILY PO) Take by mouth.    . tretinoin (RETIN-A) 0.025 % cream Apply topically at bedtime. 135 g 1  . traMADol (ULTRAM) 50 MG tablet Take 1 tablet (50 mg total) by mouth every 12 (twelve) hours as needed. 90 tablet 0   No facility-administered medications prior to visit.     Allergies  Allergen Reactions  . Amoxicillin Hives  . Aspirin Hives  . Erythromycin Hives and Rash  . Ibuprofen Hives  . Latex Rash  . Tetracyclines & Related Hives  . Triple Antibiotic [Bacitracin-Neomycin-Polymyxin] Rash    Review of Systems  Skin:       Worsening Seborrhea lesions on back some are irritated and bleed occasionally       Objective:    Physical Exam  Constitutional: She is oriented to person, place, and time. She appears well-developed and well-nourished. No distress.  HENT:  Head: Normocephalic and atraumatic.    Eyes: Conjunctivae are normal.  Neck: Neck supple. No thyromegaly present.  Cardiovascular: Normal rate, regular rhythm and normal heart sounds.   No murmur heard. Pulmonary/Chest: Effort normal and breath sounds normal. No respiratory distress.  Abdominal: Soft. Bowel sounds are normal. She exhibits no distension and no mass. There is no tenderness.  Musculoskeletal: She exhibits no edema.  Lymphadenopathy:    She has no cervical adenopathy.  Neurological: She is alert and oriented to person, place, and time.  Skin: Skin is warm and dry.  Diffuse waxy seborrhae across patient back  Psychiatric: She has a normal mood and affect. Her behavior is normal.    BP 132/72 (BP Location: Left Arm, Patient Position: Sitting, Cuff Size: Large)   Pulse 79   Temp 98.3 F (36.8 C) (Oral)   Wt 220 lb 3.2 oz (99.9 kg)   SpO2 96% Comment: RA  BMI 31.60 kg/m  Wt Readings from Last 3 Encounters:  02/21/16 220 lb 3.2 oz (99.9 kg)  08/30/15 216 lb 6 oz (98.1 kg)  02/16/15 218 lb 4 oz (99 kg)     Lab Results  Component Value Date   WBC 5.8 02/16/2015   HGB 12.7 02/16/2015   HCT 39.5 02/16/2015   PLT 294 02/16/2015   GLUCOSE 98 02/16/2015   CHOL 187 02/16/2015   TRIG 169 (H) 02/16/2015   HDL 59 02/16/2015   LDLCALC 94 02/16/2015   ALT 32 (H) 02/16/2015   AST 32 02/16/2015   NA 141 02/16/2015   K 4.7 02/16/2015   CL 106 02/16/2015   CREATININE 0.93 02/16/2015   BUN 11 02/16/2015   CO2 30 02/16/2015   TSH 1.883 02/16/2015    Lab Results  Component Value Date   TSH 1.883 02/16/2015   Lab Results  Component Value Date   WBC 5.8 02/16/2015   HGB 12.7 02/16/2015   HCT 39.5 02/16/2015   MCV 96.1 02/16/2015   PLT 294 02/16/2015   Lab Results  Component Value Date   NA 141 02/16/2015   K 4.7 02/16/2015   CO2 30 02/16/2015   GLUCOSE 98 02/16/2015   BUN 11 02/16/2015   CREATININE 0.93 02/16/2015   BILITOT 0.4 02/16/2015   ALKPHOS 49 02/16/2015   AST 32 02/16/2015   ALT 32  (H) 02/16/2015   PROT 6.6 02/16/2015   ALBUMIN 3.9 02/16/2015   CALCIUM 9.2 02/16/2015   Lab Results  Component Value  Date   CHOL 187 02/16/2015   Lab Results  Component Value Date   HDL 59 02/16/2015   Lab Results  Component Value Date   LDLCALC 94 02/16/2015   Lab Results  Component Value Date   TRIG 169 (H) 02/16/2015   Lab Results  Component Value Date   CHOLHDL 3.2 02/16/2015   No results found for: HGBA1C     Assessment & Plan:   Problem List Items Addressed This Visit    Cervical radiculopathy due to degenerative joint disease of spine    Uses Tramadol sparingly for severe pain in neck and low back, given refill today      Relevant Medications   traMADol (ULTRAM) 50 MG tablet   Overweight    Encouraged DASH diet, decrease po intake and increase exercise as tolerated. Needs 7-8 hours of sleep nightly. Avoid trans fats, eat small, frequent meals every 4-5 hours with lean proteins, complex carbs and healthy fats. Minimize simple carbs. She is interested in pursuing bariatric consultation when available. She will email [place] early next year for update.       Physical exam    Patient encouraged to maintain heart healthy diet, regular exercise, adequate sleep. Consider daily probiotics. Take medications as prescribed. Patient has ACP but takes a copy for further consideration. Sees GYN and has a MGM scheduled for Feb      History of colonic polyps    First colonoscopy, patient reports one benign polyp. Second colonoscopy roughly 5 years later was clear in 2008 in [place], unable to obtain records but patient reports she has some paperwork at home she agrees to  [employer] in. Will refer to gastroenterology for screening       Relevant Orders   Ambulatory referral to Gastroenterology    Other Visit Diagnoses    Screen for colon cancer    -  Primary   Relevant Orders   Ambulatory referral to Gastroenterology      I am having Denise Haas maintain her latanoprost,  Vitamin D3, Calcium + D3, Melatonin, B-12, Biotin, Probiotic Product (PROBIOTIC DAILY PO), Polyvinyl Alcohol-Povidone (REFRESH OP), tretinoin, and traMADol.  Meds ordered this encounter  Medications  . traMADol (ULTRAM) 50 MG tablet    Sig: Take 1 tablet (50 mg total) by mouth every 12 (twelve) hours as needed.    Dispense:  90 tablet    Refill:  0     Penni Homans, MD

## 2016-02-21 NOTE — Assessment & Plan Note (Signed)
First colonoscopy, patient reports one benign polyp. Second colonoscopy roughly 5 years later was clear in 2008 in [place], unable to obtain records but patient reports she has some paperwork at home she agrees to  [employer] in. Will refer to gastroenterology for screening

## 2016-02-22 LAB — BASIC METABOLIC PANEL
BUN: 13 mg/dL (ref 4–21)
Creatinine: 0.8 mg/dL (ref 0.5–1.1)
Glucose: 89 mg/dL
Potassium: 4.7 mmol/L (ref 3.4–5.3)
Sodium: 139 mmol/L (ref 137–147)

## 2016-02-22 LAB — HEPATIC FUNCTION PANEL
ALT: 27 U/L (ref 7–35)
AST: 26 U/L (ref 13–35)
Alkaline Phosphatase: 52 U/L (ref 25–125)
Bilirubin, Total: 0.7 mg/dL

## 2016-02-22 LAB — CBC AND DIFFERENTIAL
HEMATOCRIT: 44 % (ref 36–46)
Hemoglobin: 14.3 g/dL (ref 12.0–16.0)
NEUTROS ABS: 3064 /uL
Platelets: 294 10*3/uL (ref 150–399)
WBC: 5.5 10^3/mL

## 2016-02-22 LAB — LIPID PANEL
HDL: 58 mg/dL (ref 35–70)
LDL CALC: 151 mg/dL
Triglycerides: 147 mg/dL (ref 40–160)

## 2016-02-28 ENCOUNTER — Encounter: Payer: Self-pay | Admitting: Family Medicine

## 2016-02-28 NOTE — Progress Notes (Signed)
[facility] 02/22/2016

## 2016-02-29 ENCOUNTER — Encounter: Payer: Self-pay | Admitting: Family Medicine

## 2016-03-23 ENCOUNTER — Encounter: Payer: Self-pay | Admitting: Family Medicine

## 2016-05-13 ENCOUNTER — Telehealth: Payer: Self-pay | Admitting: Family Medicine

## 2016-05-13 ENCOUNTER — Encounter: Payer: Self-pay | Admitting: Family Medicine

## 2016-05-15 MED ORDER — TRAMADOL HCL 50 MG PO TABS: 50.0000 mg | ORAL_TABLET | Freq: Two times a day (BID) | ORAL | 0 refills | Status: DC | PRN

## 2016-05-15 NOTE — Telephone Encounter (Signed)
Patient made aware of rx in the front office.  And also leaving a UDS and sign a contract.  PC

## 2016-05-15 NOTE — Telephone Encounter (Signed)
Pt is requesting refill on Tramadol.  Last OV: 02/21/2016  Last Fill" 02/21/2016 #90 and 0RF UDS: None seen  Please advise.

## 2016-05-15 NOTE — Telephone Encounter (Signed)
OK to refill but needs contract and UDSprin

## 2016-05-18 ENCOUNTER — Encounter: Payer: Self-pay | Admitting: Family Medicine

## 2016-06-05 ENCOUNTER — Encounter: Payer: Self-pay | Admitting: Family Medicine

## 2016-06-06 MED ORDER — LORAZEPAM 0.5 MG PO TABS: 0.5000 mg | ORAL_TABLET | Freq: Two times a day (BID) | ORAL | 0 refills | Status: DC | PRN

## 2016-08-07 ENCOUNTER — Other Ambulatory Visit: Payer: Self-pay | Admitting: Family Medicine

## 2016-08-08 MED ORDER — TRAMADOL HCL 50 MG PO TABS: 50.0000 mg | ORAL_TABLET | Freq: Two times a day (BID) | ORAL | 0 refills | Status: DC | PRN

## 2016-08-08 NOTE — Telephone Encounter (Signed)
Faxed a hard copy of the Rx to [facility] at [id number] in [place], [place].

## 2016-08-09 MED ORDER — TRAMADOL HCL 50 MG PO TABS: 50.0000 mg | ORAL_TABLET | Freq: Two times a day (BID) | ORAL | 0 refills | Status: DC | PRN

## 2016-08-09 NOTE — Addendum Note (Signed)
Addended by: Sharen Counter D on: 08/09/2016 01:02 PM   Modules accepted: Orders

## 2016-08-10 ENCOUNTER — Telehealth: Payer: Self-pay | Admitting: Family Medicine

## 2016-08-10 NOTE — Telephone Encounter (Signed)
Disregard this message HT was getting duplicate refills due to fax machine problem

## 2016-08-10 NOTE — Telephone Encounter (Signed)
Faxed hardcopy for Tramadol to [facility]

## 2016-08-10 NOTE — Telephone Encounter (Signed)
Margaret from   [facility] at [address] [address], [place] [phone] (Phone) [phone] (Fax)   Received a few Rx for traMADol (ULTRAM) 50 MG tablet in need of clarification, please advise

## 2016-08-28 ENCOUNTER — Ambulatory Visit (INDEPENDENT_AMBULATORY_CARE_PROVIDER_SITE_OTHER): Payer: No Typology Code available for payment source | Admitting: Family Medicine

## 2016-08-28 ENCOUNTER — Encounter: Payer: Self-pay | Admitting: Family Medicine

## 2016-08-28 DIAGNOSIS — Z1211 Encounter for screening for malignant neoplasm of colon: Secondary | ICD-10-CM

## 2016-08-28 DIAGNOSIS — L210 Seborrhea capitis: Secondary | ICD-10-CM | POA: Insufficient documentation

## 2016-08-28 DIAGNOSIS — Z86010 Personal history of colonic polyps: Secondary | ICD-10-CM

## 2016-08-28 DIAGNOSIS — E663 Overweight: Secondary | ICD-10-CM | POA: Diagnosis not present

## 2016-08-28 DIAGNOSIS — M4722 Other spondylosis with radiculopathy, cervical region: Secondary | ICD-10-CM

## 2016-08-28 HISTORY — DX: Seborrhea capitis: L21.0

## 2016-08-28 MED ORDER — TIZANIDINE HCL 4 MG PO TABS: 4.0000 mg | ORAL_TABLET | Freq: Every evening | ORAL | 1 refills | Status: DC | PRN

## 2016-08-28 MED ORDER — TRETINOIN 0.025 % EX CREA: TOPICAL_CREAM | Freq: Every day | CUTANEOUS | 1 refills | Status: DC

## 2016-08-28 NOTE — Patient Instructions (Signed)
Selsun blue/Neutrogena T gel wash with this 2-3 x a week Alternate with Coal tar shampoo and use Clobetazol solution as needed for symptome Vanos solution is another option  Seborrheic Dermatitis, Adult Seborrheic dermatitis is a skin disease that causes red, scaly patches. It usually occurs on the scalp, and it is often called dandruff. The patches may appear on other parts of the body. Skin patches tend to appear where there are many oil glands in the skin. Areas of the body that are commonly affected include:  Scalp.  Skin folds of the body.  Ears.  Eyebrows.  Neck.  Face.  Armpits.  The bearded area of men's faces.  The condition may come and go for no known reason, and it is often long-lasting (chronic). What are the causes? The cause of this condition is not known. What increases the risk? This condition is more likely to develop in people who:  Have certain conditions, such as: ? HIV (human immunodeficiency virus). ? AIDS (acquired immunodeficiency syndrome). ? Parkinson disease. ? Mood disorders, such as depression.  Are 3-77 years old.  What are the signs or symptoms? Symptoms of this condition include:  Thick scales on the scalp.  Redness on the face or in the armpits.  Skin that is flaky. The flakes may be white or yellow.  Skin that seems oily or dry but is not helped with moisturizers.  Itching or burning in the affected areas.  How is this diagnosed? This condition is diagnosed with a medical history and physical exam. A sample of your skin may be tested (skin biopsy). You may need to see a skin specialist (dermatologist). How is this treated? There is no cure for this condition, but treatment can help to manage the symptoms. You may get treatment to remove scales, lower the risk of skin infection, and reduce swelling or itching. Treatment may include:  Creams that reduce swelling and irritation (steroids).  Creams that reduce skin  yeast.  Medicated shampoo, soaps, moisturizing creams, or ointments.  Medicated moisturizing creams or ointments.  Follow these instructions at home:  Apply over-the-counter and prescription medicines only as told by your health care provider.  Use any medicated shampoo, soaps, skin creams, or ointments only as told by your health care provider.  Keep all follow-up visits as told by your health care provider. This is important. Contact a health care provider if:  Your symptoms do not improve with treatment.  Your symptoms get worse.  You have new symptoms. This information is not intended to replace advice given to you by your health care provider. Make sure you discuss any questions you have with your health care provider. Document Released: 02/27/2005 Document Revised: 09/17/2015 Document Reviewed: 06/17/2015 Elsevier Interactive Patient Education  [employer].

## 2016-08-28 NOTE — Progress Notes (Signed)
Patient ID: Denise Haas, female   DOB: 1946-02-03, 71 y.o.   MRN: [id number]   Subjective:    Patient ID: Denise Haas, female    DOB: May 18, 1945, 71 y.o.   MRN: [id number]  No chief complaint on file.   HPI Patient is in today for follow up. She is frustrated with itchy scalp. She has been seeing dermatology and has been diagnosed with seborrhea. Unfortunately she has not responded to a combination of ketoconazole and clobetasol. It is mostly located behind her ear so she started was her glasses but it has not improved with a change in glasses. She denies any other recent or acute illness. No recent hospitalization. Denies CP/palp/SOB/HA/congestion/fevers/GI or GU c/o. Taking meds as prescribed  Past Medical History:  Diagnosis Date  . Abnormal liver function test 02/20/2015  . Acne   . Acute bronchitis 02/20/2015  . Arthritis   . GERD (gastroesophageal reflux disease)   . Glaucoma   . H/O measles   . H/O mumps   . History of chicken pox   . History of colonic polyps   . History of colonic polyps 02/21/2016  . Seborrhea capitis 08/28/2016    Past Surgical History:  Procedure Laterality Date  . [place]   cage placed in lumbar  . BREAST SURGERY     benign left breast biopsy for cyst  . EYE SURGERY     cataracts b/l    Family History  Problem Relation Age of Onset  . Cancer Mother        breast  . Heart disease Mother   . Diabetes Mother   . Asthma Mother   . Hyperlipidemia Mother   . Peripheral vascular disease Mother   . COPD Father   . Heart disease Father   . Stroke Maternal Grandmother   . Heart disease Maternal Grandfather   . Cancer Paternal Grandfather        throat  . Glaucoma Sister   . Glaucoma Brother   . Heart disease Brother   . Glaucoma Brother   . Cancer Brother        skin cancer, lost tip of thumb  . Glaucoma Brother   . Crohn's disease Brother        colostomy in place  . Cancer Cousin        colon     Social History    Social History  . Marital status: Married    Spouse name: N/A  . Number of children: N/A  . Years of education: N/A   Occupational History  . Not on file.   Social History Main Topics  . Smoking status: Never Smoker  . Smokeless tobacco: Not on file  . Alcohol use Yes  . Drug use: No  . 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 no dietary restrictions. wears seat belt   Other Topics Concern  . Not on file   Social History Narrative  . No narrative on file    Outpatient Medications Prior to Visit  Medication Sig Dispense Refill  . Biotin 300 MCG TABS Take 1 tablet by mouth daily.    . Calcium Carb-Cholecalciferol (CALCIUM + D3) 600-200 MG-UNIT TABS Take 1 tablet by mouth 2 (two) times daily.    . Cholecalciferol (VITAMIN D3) 2000 UNITS TABS Take 1 tablet by mouth daily.    . Cyanocobalamin (B-12) 2500 MCG TABS Take 1 tablet by mouth daily.    Marland Kitchen  latanoprost (XALATAN) 0.005 % ophthalmic solution     . Melatonin 2.5 MG CAPS Take 1 capsule by mouth Nightly.    . Polyvinyl Alcohol-Povidone (REFRESH OP) Apply to eye.    . Probiotic Product (PROBIOTIC DAILY PO) Take by mouth.    . traMADol (ULTRAM) 50 MG tablet Take 1 tablet (50 mg total) by mouth every 12 (twelve) hours as needed. 90 tablet 0  . tretinoin (RETIN-A) 0.025 % cream Apply topically at bedtime. 135 g 1  . LORazepam (ATIVAN) 0.5 MG tablet Take 1 tablet (0.5 mg total) by mouth 2 (two) times daily as needed for anxiety. 20 tablet 0   No facility-administered medications prior to visit.     Allergies  Allergen Reactions  . Amoxicillin Hives  . Aspirin Hives  . Erythromycin Hives and Rash  . Ibuprofen Hives  . Latex Rash  . Tetracyclines & Related Hives  . Triple Antibiotic [Bacitracin-Neomycin-Polymyxin] Rash    Review of Systems  Constitutional: Positive for malaise/fatigue. Negative for fever.  HENT: Negative for congestion.   Eyes: Negative for blurred vision.   Respiratory: Negative for shortness of breath.   Cardiovascular: Negative for chest pain, palpitations and leg swelling.  Gastrointestinal: Negative for abdominal pain, blood in stool and nausea.  Genitourinary: Negative for dysuria and frequency.  Musculoskeletal: Negative for falls.  Skin: Positive for itching and rash.  Neurological: Negative for dizziness, loss of consciousness and headaches.  Endo/Heme/Allergies: Negative for environmental allergies.  Psychiatric/Behavioral: Negative for depression. The patient is not nervous/anxious.        Objective:    Physical Exam  Constitutional: She is oriented to person, place, and time. She appears well-developed and well-nourished. No distress.  HENT:  Head: Normocephalic and atraumatic.  Nose: Nose normal.  Eyes: Right eye exhibits no discharge. Left eye exhibits no discharge.  Neck: Normal range of motion. Neck supple.  Cardiovascular: Normal rate and regular rhythm.   No murmur heard. Pulmonary/Chest: Effort normal and breath sounds normal.  Abdominal: Soft. Bowel sounds are normal. There is no tenderness.  Musculoskeletal: She exhibits no edema.  Neurological: She is alert and oriented to person, place, and time.  Skin: Skin is warm and dry.  Light brown macular lesions on scalp behind ears  Psychiatric: She has a normal mood and affect.  Nursing note and vitals reviewed.   There were no vitals taken for this visit. Wt Readings from Last 3 Encounters:  02/21/16 220 lb 3.2 oz (99.9 kg)  08/30/15 216 lb 6 oz (98.1 kg)  02/16/15 218 lb 4 oz (99 kg)     Lab Results  Component Value Date   WBC 5.5 02/22/2016   HGB 14.3 02/22/2016   HCT 44 02/22/2016   PLT 294 02/22/2016   GLUCOSE 98 02/16/2015   CHOL 187 02/16/2015   TRIG 147 02/22/2016   HDL 58 02/22/2016   LDLCALC 151 02/22/2016   ALT 27 02/22/2016   AST 26 02/22/2016   NA 139 02/22/2016   K 4.7 02/22/2016   CL 106 02/16/2015   CREATININE 0.8 02/22/2016    BUN 13 02/22/2016   CO2 30 02/16/2015   TSH 1.883 02/16/2015    Lab Results  Component Value Date   TSH 1.883 02/16/2015   Lab Results  Component Value Date   WBC 5.5 02/22/2016   HGB 14.3 02/22/2016   HCT 44 02/22/2016   MCV 96.1 02/16/2015   PLT 294 02/22/2016   Lab Results  Component Value Date   NA  139 02/22/2016   K 4.7 02/22/2016   CO2 30 02/16/2015   GLUCOSE 98 02/16/2015   BUN 13 02/22/2016   CREATININE 0.8 02/22/2016   BILITOT 0.4 02/16/2015   ALKPHOS 52 02/22/2016   AST 26 02/22/2016   ALT 27 02/22/2016   PROT 6.6 02/16/2015   ALBUMIN 3.9 02/16/2015   CALCIUM 9.2 02/16/2015   Lab Results  Component Value Date   CHOL 187 02/16/2015   Lab Results  Component Value Date   HDL 58 02/22/2016   Lab Results  Component Value Date   LDLCALC 151 02/22/2016   Lab Results  Component Value Date   TRIG 147 02/22/2016   Lab Results  Component Value Date   CHOLHDL 3.2 02/16/2015   No results found for: HGBA1C     Assessment & Plan:   Problem List Items Addressed This Visit    Cervical radiculopathy due to degenerative joint disease of spine    Would like to try stopping the Tramadol and so will d/c and try Tizanidine 2-4 mg po qhs to see if that helps      Overweight    Encouraged DASH diet, decrease po intake and increase exercise as tolerated. Needs 7-8 hours of sleep nightly. Avoid trans fats, eat small, frequent meals every 4-5 hours with lean proteins, complex carbs and healthy fats. Minimize simple carbs      History of colonic polyps    No change in bowel habits but has not returned for follow up colonoscopy. Will refer back to GI for further consideration      Seborrhea capitis    Has been seeing dermatology but not responding to clobetazol and KeotconazolSelsun blue/Neutrogena T gel wash with this 2-3 x a week Alternate with Coal tar shampoo and use Clobetazol solution as needed for symptome Vanos solution is another optione. Will d/c  ketoconazole.        Other Visit Diagnoses    Colon cancer screening    -  Primary   Relevant Orders   Ambulatory referral to Gastroenterology      I have discontinued Ms. Scarantino's LORazepam and traMADol. I am also having her start on tiZANidine. Additionally, I am having her maintain her latanoprost, Vitamin D3, Calcium + D3, Melatonin, B-12, Biotin, Probiotic Product (PROBIOTIC DAILY PO), Polyvinyl Alcohol-Povidone (REFRESH OP), and tretinoin.  Meds ordered this encounter  Medications  . DISCONTD: tretinoin (RETIN-A) 0.025 % cream    Sig: Apply topically at bedtime.    Dispense:  135 g    Refill:  1  . tiZANidine (ZANAFLEX) 4 MG tablet    Sig: Take 1 tablet (4 mg total) by mouth at bedtime as needed for muscle spasms.    Dispense:  90 tablet    Refill:  1  . tretinoin (RETIN-A) 0.025 % cream    Sig: Apply topically at bedtime.    Dispense:  135 g    Refill:  1     Penni Homans, MD

## 2016-08-28 NOTE — Progress Notes (Signed)
0

## 2016-08-28 NOTE — Assessment & Plan Note (Signed)
Encouraged DASH diet, decrease po intake and increase exercise as tolerated. Needs 7-8 hours of sleep nightly. Avoid trans fats, eat small, frequent meals every 4-5 hours with lean proteins, complex carbs and healthy fats. Minimize simple carbs 

## 2016-08-28 NOTE — Assessment & Plan Note (Signed)
Has been seeing dermatology but not responding to clobetazol and KeotconazolSelsun blue/Neutrogena T gel wash with this 2-3 x a week Alternate with Coal tar shampoo and use Clobetazol solution as needed for symptome Vanos solution is another optione. Will d/c ketoconazole.

## 2016-08-28 NOTE — Assessment & Plan Note (Signed)
No change in bowel habits but has not returned for follow up colonoscopy. Will refer back to GI for further consideration

## 2016-08-28 NOTE — Assessment & Plan Note (Signed)
Would like to try stopping the Tramadol and so will d/c and try Tizanidine 2-4 mg po qhs to see if that helps

## 2016-09-18 ENCOUNTER — Encounter: Payer: Self-pay | Admitting: Gastroenterology

## 2016-10-12 ENCOUNTER — Ambulatory Visit (AMBULATORY_SURGERY_CENTER): Payer: Self-pay

## 2016-10-12 VITALS — Ht 70.0 in | Wt 213.6 lb

## 2016-10-12 DIAGNOSIS — Z1211 Encounter for screening for malignant neoplasm of colon: Secondary | ICD-10-CM

## 2016-10-12 MED ORDER — SUPREP BOWEL PREP KIT 17.5-3.13-1.6 GM/177ML PO SOLN: 1.0000 | Freq: Once | ORAL | 0 refills | Status: AC

## 2016-10-12 NOTE — Progress Notes (Signed)
No allergies to eggs or soy No diet meds No home oxygen No past problems with anesthesia  Declined emmi 

## 2016-10-26 ENCOUNTER — Encounter: Payer: No Typology Code available for payment source | Admitting: Gastroenterology

## 2017-01-24 ENCOUNTER — Telehealth: Payer: Self-pay | Admitting: Family Medicine

## 2017-01-24 NOTE — Telephone Encounter (Signed)
Copied from [place]. Topic: General - Other >> Jan 24, 2017 10:27 AM Denise Haas wrote: Reason for CRM: patient wanting to leave Dr Charlett Blake and start seeing Dr Arnette Norris at [facility] patient states that she is 71 yrs old and its easier for her to go see that provider

## 2017-01-26 NOTE — Telephone Encounter (Signed)
She can see who she wants to

## 2017-02-15 ENCOUNTER — Encounter: Payer: Self-pay | Admitting: Family Medicine

## 2017-02-15 ENCOUNTER — Ambulatory Visit: Payer: No Typology Code available for payment source | Admitting: Family Medicine

## 2017-02-15 VITALS — BP 136/88 | HR 77 | Temp 98.5°F | Ht 69.25 in | Wt 209.4 lb

## 2017-02-15 DIAGNOSIS — M542 Cervicalgia: Secondary | ICD-10-CM | POA: Diagnosis not present

## 2017-02-15 NOTE — Assessment & Plan Note (Signed)
Known OA. No changes made today. Normal grip strength and she denies pain.

## 2017-02-15 NOTE — Patient Instructions (Signed)
Great to meet you.   I will see you at your physical next week!

## 2017-02-15 NOTE — Progress Notes (Signed)
Subjective:   Patient ID: Denise Haas, female    DOB: 05-11-1945, 71 y.o.   MRN: [id number]  Denise Haas is a pleasant 71 y.o. year old female who presents to clinic today with [place] (Patient is here today to establish care.  She has an appointment next week for a CPE. ) and Neck Pain (Patient states that she is suffering from a flare-up of the arthritis in her neck.  Right now it is bothering her down her arm with a nerve sensation.  (Advanced Arthritis of the Cervical Spine))  on 02/15/2017  HPI:  Arthritis of cervical spine-  Diagnosed with "severe arthritis" years ago. Remote h/o lumbar spine surgery but has never had neck surgery.  Left hand tingles but she is not having any pain.  Aspirin seems to help.   CLINICAL DATA:  Neck pain after carrying heavy objects.  EXAM: CERVICAL SPINE  4+ VIEWS  COMPARISON:  None.  FINDINGS: Diffuse degenerative change with prominent endplate osteophyte formation and disc space loss C5-C6 is C6-C7. Diffuse osteopenia. No evidence fracture or dislocation. Loss of normal cervical spine lordosis noted, most likely from degenerative disease. Multilevel mild bilateral neural foraminal narrowing secondary to degenerative change.  IMPRESSION: No evidence of fracture or dislocation. 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  Intermittent left sided rotator cuff syndrome as well.  Current Outpatient Medications on File Prior to Visit  Medication Sig Dispense Refill  . Calcium Carb-Cholecalciferol (CALCIUM + D3) 600-200 MG-UNIT TABS Take 1 tablet by mouth 2 (two) times daily.    . Cyanocobalamin (B-12) 2500 MCG TABS Take 1 tablet by mouth daily.    . folic acid (FOLVITE) 240 MCG tablet Take 800 mcg by mouth daily.    Marland Kitchen latanoprost (XALATAN) 0.005 % ophthalmic solution     . Melatonin 2.5 MG CAPS Take 1 capsule by mouth Nightly.    . Misc Natural Products (TART CHERRY  ADVANCED PO) Take 1,200 mg by mouth daily.    . Multiple Vitamin (MULTIVITAMIN) capsule Take 1 capsule by mouth daily.    Marland Kitchen OVER THE COUNTER MEDICATION Hylaurionic Acid For arthritis    . Polyvinyl Alcohol-Povidone (REFRESH OP) Apply to eye.    . Probiotic Product (PROBIOTIC DAILY PO) Take by mouth.    . Riboflavin (VITAMIN B2 PO) Take by mouth.    . traMADol (ULTRAM) 50 MG tablet Take 50 mg by mouth 2 (two) times daily as needed.    . tretinoin (RETIN-A) 0.025 % cream Apply topically at bedtime. 135 g 1   No current facility-administered medications on file prior to visit.     Allergies  Allergen Reactions  . Amoxicillin Hives  . Aspirin Hives  . Erythromycin Hives and Rash  . Ibuprofen Hives  . Latex Rash  . Tetracyclines & Related Hives  . Triple Antibiotic [Bacitracin-Neomycin-Polymyxin] Rash    Past Medical History:  Diagnosis Date  . Abnormal liver function test 02/20/2015  . Acne   . Acute bronchitis 02/20/2015  . Arthritis   . Cataract   . GERD (gastroesophageal reflux disease)   . Glaucoma   . H/O measles   . H/O mumps   . History of chicken pox   . History of colonic polyps   . History of colonic polyps 02/21/2016  . Neuromuscular disorder ([place])   . Osteopenia   . Seborrhea capitis 08/28/2016    Past Surgical History:  Procedure Laterality Date  . BACK  SURGERY  1995   cage placed in lumbar  . BREAST SURGERY     benign left breast biopsy for cyst  . EYE SURGERY     cataracts b/l    Family History  Problem Relation Age of Onset  . Cancer Mother        breast  . Heart disease Mother   . Diabetes Mother   . Asthma Mother   . Hyperlipidemia Mother   . Peripheral vascular disease Mother   . COPD Father   . Heart disease Father   . Arthritis Father   . Stroke Maternal Grandmother   . Heart disease Maternal Grandmother   . Heart disease Maternal Grandfather   . Heart attack Maternal Grandfather   . Cancer Paternal Grandfather        throat  .  Glaucoma Sister   . Glaucoma Brother   . Heart disease Brother   . Alcohol abuse Brother   . Depression Brother   . Hyperlipidemia Brother   . COPD Daughter   . Glaucoma Brother   . Cancer Brother        skin cancer, lost tip of thumb  . Glaucoma Brother   . Crohn's disease Brother        colostomy in place  . Cancer Cousin        colon   . Colon cancer Cousin     Social History   Socioeconomic History  . Marital status: Married    Spouse name: Not on file  . Number of children: Not on file  . Years of education: Not on file  . Highest education level: Not on file  Social Needs  . Financial resource strain: Not on file  . Food insecurity - worry: Not on file  . Food insecurity - inability: Not on file  . Transportation needs - medical: Not on file  . Transportation needs - non-medical: Not on file  Occupational History  . Not on file  Tobacco Use  . Smoking status: Never Smoker  . Smokeless tobacco: Never Used  Substance and Sexual Activity  . Alcohol use: Yes    Alcohol/week: 8.4 oz    Types: 14 Glasses of wine per week  . Drug use: No  . 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 Not on file  Social History Narrative  . Not on file   The PMH, PSH, Social History, Family History, Medications, and allergies have been reviewed in [facility], and have been updated if relevant.   Review of Systems  Constitutional: Negative.   Musculoskeletal: Positive for neck pain.  Neurological: Positive for numbness. Negative for weakness.  All other systems reviewed and are negative.      Objective:    BP 136/88 (BP Location: Left Arm, Patient Position: Sitting, Cuff Size: Normal)   Pulse 77   Temp 98.5 F (36.9 C) (Oral)   Ht 5' 9.25" (1.759 m)   Wt 209 lb 6.4 oz (95 kg)   SpO2 96%   BMI 30.70 kg/m    Physical Exam  Constitutional: She is oriented to person, place, and  time. She appears well-developed and well-nourished. No distress.  HENT:  Head: Normocephalic and atraumatic.  Neck: Neck supple. No spinous process tenderness and no muscular tenderness present.  Cardiovascular: Normal rate.  Pulmonary/Chest: Effort normal.  Neurological: She is alert and oriented to person, place, and time. No  cranial nerve deficit.  Skin: She is not diaphoretic.  Nursing note and vitals reviewed.         Assessment & Plan:   Neck pain No Follow-up on file.

## 2017-02-22 ENCOUNTER — Encounter: Payer: Self-pay | Admitting: Family Medicine

## 2017-02-22 ENCOUNTER — Ambulatory Visit (INDEPENDENT_AMBULATORY_CARE_PROVIDER_SITE_OTHER): Payer: No Typology Code available for payment source | Admitting: Family Medicine

## 2017-02-22 VITALS — BP 138/78 | HR 92 | Temp 98.6°F | Ht 69.5 in | Wt 209.8 lb

## 2017-02-22 DIAGNOSIS — Z0000 Encounter for general adult medical examination without abnormal findings: Secondary | ICD-10-CM

## 2017-02-22 MED ORDER — TRAMADOL HCL 50 MG PO TABS: 50.0000 mg | ORAL_TABLET | Freq: Two times a day (BID) | ORAL | 0 refills | Status: DC | PRN

## 2017-02-22 MED ORDER — TRETINOIN 0.025 % EX CREA: TOPICAL_CREAM | Freq: Every day | CUTANEOUS | 3 refills | Status: DC

## 2017-02-22 NOTE — Progress Notes (Signed)
Subjective:   Patient ID: Denise Haas, female    DOB: 1945-08-18, 71 y.o.   MRN: [id number]  Denise Haas is a pleasant 71 y.o. year old female who presents to clinic today with Annual Exam (Patient is here today for a CPE.  She is currently fasting and all labs need to go to [facility].)  on 02/22/2017  HPI:  Due for colonoscopy.  She is willing to try Cologuard.  Mammogram UTD.  Has GYN.  Denies any post menopausal bleeding.  Health Maintenance  Topic Date Due  . COLONOSCOPY  02/15/2018 (Originally 03/13/2016)  . MAMMOGRAM  04/13/2018  . TETANUS/TDAP  12/21/2019  . INFLUENZA VACCINE  Completed  . DEXA SCAN  Completed  . Hepatitis C Screening  Completed  . PNA vac Low Risk Adult  Completed    Current Outpatient Medications on File Prior to Visit  Medication Sig Dispense Refill  . Calcium Carb-Cholecalciferol (CALCIUM + D3) 600-200 MG-UNIT TABS Take 1 tablet by mouth 2 (two) times daily.    . Cyanocobalamin (B-12) 2500 MCG TABS Take 1 tablet by mouth daily.    . folic acid (FOLVITE) 270 MCG tablet Take 800 mcg by mouth daily.    Marland Kitchen latanoprost (XALATAN) 0.005 % ophthalmic solution     . Melatonin 2.5 MG CAPS Take 1 capsule by mouth Nightly.    . Misc Natural Products (TART CHERRY ADVANCED PO) Take 1,200 mg by mouth daily.    . Multiple Vitamin (MULTIVITAMIN) capsule Take 1 capsule by mouth daily.    Marland Kitchen OVER THE COUNTER MEDICATION Hylaurionic Acid For arthritis    . Polyvinyl Alcohol-Povidone (REFRESH OP) Apply to eye.    . Probiotic Product (PROBIOTIC DAILY PO) Take by mouth.    . Riboflavin (VITAMIN B2 PO) Take by mouth.     No current facility-administered medications on file prior to visit.     Allergies  Allergen Reactions  . Amoxicillin Hives  . Aspirin Hives  . Erythromycin Hives and Rash  . Ibuprofen Hives  . Latex Rash  . Tetracyclines & Related Hives  . Triple Antibiotic [Bacitracin-Neomycin-Polymyxin] Rash    Past Medical History:  Diagnosis Date  .  Abnormal liver function test 02/20/2015  . Acne   . Acute bronchitis 02/20/2015  . Arthritis   . Cataract   . GERD (gastroesophageal reflux disease)   . Glaucoma   . H/O measles   . H/O mumps   . History of chicken pox   . History of colonic polyps   . History of colonic polyps 02/21/2016  . Neuromuscular disorder ([place])   . Osteopenia   . Seborrhea capitis 08/28/2016    Past Surgical History:  Procedure Laterality Date  . [place]   cage placed in lumbar  . BREAST SURGERY     benign left breast biopsy for cyst  . EYE SURGERY     cataracts b/l    Family History  Problem Relation Age of Onset  . Cancer Mother        breast  . Heart disease Mother   . Diabetes Mother   . Asthma Mother   . Hyperlipidemia Mother   . Peripheral vascular disease Mother   . COPD Father   . Heart disease Father   . Arthritis Father   . Stroke Maternal Grandmother   . Heart disease Maternal Grandmother   . Heart disease Maternal Grandfather   . Heart attack Maternal Grandfather   . Cancer Paternal Grandfather  throat  . Glaucoma Sister   . Glaucoma Brother   . Heart disease Brother   . Alcohol abuse Brother   . Depression Brother   . Hyperlipidemia Brother   . COPD Daughter   . Glaucoma Brother   . Cancer Brother        skin cancer, lost tip of thumb  . Glaucoma Brother   . Crohn's disease Brother        colostomy in place  . Cancer Cousin        colon   . Colon cancer Cousin     Social History   Socioeconomic History  . Marital status: Married    Spouse name: Not on file  . Number of children: Not on file  . Years of education: Not on file  . Highest education level: Not on file  Social Needs  . Financial resource strain: Not on file  . Food insecurity - worry: Not on file  . Food insecurity - inability: Not on file  . Transportation needs - medical: Not on file  . Transportation needs - non-medical: Not on file  Occupational History  . Not on file    Tobacco Use  . Smoking status: Never Smoker  . Smokeless tobacco: Never Used  Substance and Sexual Activity  . Alcohol use: Yes    Alcohol/week: 8.4 oz    Types: 14 Glasses of wine per week  . Drug use: No  . 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 Not on file  Social History Narrative  . Not on file   The PMH, PSH, Social History, Family History, Medications, and allergies have been reviewed in [facility], and have been updated if relevant.  Review of Systems  Constitutional: Negative.   HENT: Negative.   Eyes: Negative.   Respiratory: Negative.   Cardiovascular: Negative.   Gastrointestinal: Negative.   Endocrine: Negative.   Genitourinary: Negative.   Musculoskeletal: Negative.   Skin: Negative.   Allergic/Immunologic: Negative.   Neurological: Negative.   Hematological: Negative.   Psychiatric/Behavioral: Negative.   All other systems reviewed and are negative.      Objective:    BP 138/78 (BP Location: Left Arm, Patient Position: Sitting, Cuff Size: Normal)   Pulse 92   Temp 98.6 F (37 C) (Oral)   Ht 5' 9.5" (1.765 m)   Wt 209 lb 12.8 oz (95.2 kg)   SpO2 96%   BMI 30.54 kg/m    Physical Exam   General:  Well-developed,well-nourished,in no acute distress; alert,appropriate and cooperative throughout examination Head:  normocephalic and atraumatic.   Eyes:  vision grossly intact, PERRL Ears:  R ear normal and L ear normal externally, TMs clear bilaterally Nose:  no external deformity.   Mouth:  good dentition.   Neck:  No deformities, masses, or tenderness noted. Breasts:  No mass, nodules, thickening, tenderness, bulging, retraction, inflamation, nipple discharge or skin changes noted.   Lungs:  Normal respiratory effort, chest expands symmetrically. Lungs are clear to auscultation, no crackles or wheezes. Heart:  Normal rate and regular rhythm. S1 and S2  normal without gallop, murmur, click, rub or other extra sounds. Abdomen:  Bowel sounds positive,abdomen soft and non-tender without masses, organomegaly or hernias noted. Msk:  No deformity or scoliosis noted of thoracic or lumbar spine.   Extremities:  No clubbing, cyanosis, edema, or deformity noted with normal full range of motion of  all joints.   Neurologic:  alert & oriented X3 and gait normal.   Skin:  Intact without suspicious lesions or rashes Cervical Nodes:  No lymphadenopathy noted Axillary Nodes:  No palpable lymphadenopathy Psych:  Cognition and judgment appear intact. Alert and cooperative with normal attention span and concentration. No apparent delusions, illusions, hallucinations       Assessment & Plan:   Physical exam - Plan: Hepatitis C antibody, CBC with Differential/Platelet, Comprehensive metabolic panel, Lipid panel, TSH No Follow-up on file.

## 2017-02-22 NOTE — Patient Instructions (Signed)
Great to see you.  Happy Holidays.  I will look into cologuard for you.

## 2017-02-22 NOTE — Assessment & Plan Note (Signed)
Reviewed preventive care protocols, scheduled due services, and updated immunizations Discussed nutrition, exercise, diet, and healthy lifestyle.  Orders Placed This Encounter  Procedures  . Hepatitis C antibody  . CBC with Differential/Platelet  . Comprehensive metabolic panel  . Lipid panel  . TSH

## 2017-02-23 ENCOUNTER — Encounter: Payer: Self-pay | Admitting: Family Medicine

## 2017-02-23 LAB — CBC WITH DIFFERENTIAL/PLATELET
BASOS ABS: 30 {cells}/uL (ref 0–200)
Basophils Relative: 0.5 %
EOS ABS: 130 {cells}/uL (ref 15–500)
Eosinophils Relative: 2.2 %
HEMATOCRIT: 38.9 % (ref 35.0–45.0)
Hemoglobin: 13.3 g/dL (ref 11.7–15.5)
LYMPHS ABS: 1788 {cells}/uL (ref 850–3900)
MCH: 31.4 pg (ref 27.0–33.0)
MCHC: 34.2 g/dL (ref 32.0–36.0)
MCV: 91.7 fL (ref 80.0–100.0)
MPV: 10.4 fL (ref 7.5–12.5)
Monocytes Relative: 7.6 %
Neutro Abs: 3505 cells/uL (ref 1500–7800)
Neutrophils Relative %: 59.4 %
Platelets: 268 10*3/uL (ref 140–400)
RBC: 4.24 10*6/uL (ref 3.80–5.10)
RDW: 12.4 % (ref 11.0–15.0)
Total Lymphocyte: 30.3 %
WBC mixed population: 448 cells/uL (ref 200–950)
WBC: 5.9 10*3/uL (ref 3.8–10.8)

## 2017-02-23 LAB — COMPREHENSIVE METABOLIC PANEL
AG RATIO: 1.4 (calc) (ref 1.0–2.5)
ALT: 22 U/L (ref 6–29)
AST: 20 U/L (ref 10–35)
Albumin: 4 g/dL (ref 3.6–5.1)
Alkaline phosphatase (APISO): 48 U/L (ref 33–130)
BUN: 14 mg/dL (ref 7–25)
CHLORIDE: 105 mmol/L (ref 98–110)
CO2: 23 mmol/L (ref 20–32)
CREATININE: 0.74 mg/dL (ref 0.60–0.93)
Calcium: 10.1 mg/dL (ref 8.6–10.4)
GLOBULIN: 2.8 g/dL (ref 1.9–3.7)
GLUCOSE: 106 mg/dL — AB (ref 65–99)
Potassium: 4.6 mmol/L (ref 3.5–5.3)
SODIUM: 139 mmol/L (ref 135–146)
TOTAL PROTEIN: 6.8 g/dL (ref 6.1–8.1)
Total Bilirubin: 0.6 mg/dL (ref 0.2–1.2)

## 2017-02-23 LAB — LIPID PANEL
CHOL/HDL RATIO: 2.9 (calc) (ref <5.0)
Cholesterol: 201 mg/dL — ABNORMAL HIGH (ref <200)
HDL: 70 mg/dL (ref >50)
LDL Cholesterol (Calc): 109 mg/dL (calc) — ABNORMAL HIGH
NON-HDL CHOLESTEROL (CALC): 131 mg/dL — AB (ref <130)
TRIGLYCERIDES: 110 mg/dL (ref <150)

## 2017-02-23 LAB — HEPATITIS C ANTIBODY
Hepatitis C Ab: NONREACTIVE
SIGNAL TO CUT-OFF: 0.01 (ref <1.00)

## 2017-02-23 LAB — TSH: TSH: 1.49 mIU/L (ref 0.40–4.50)

## 2017-02-26 ENCOUNTER — Other Ambulatory Visit: Payer: Self-pay

## 2017-02-26 MED ORDER — TRETINOIN 0.025 % EX CREA: TOPICAL_CREAM | Freq: Every day | CUTANEOUS | 3 refills | Status: DC

## 2017-02-26 NOTE — Progress Notes (Signed)
MO did not rec Retin-A/I refaxed/thx dmf

## 2017-02-27 ENCOUNTER — Telehealth: Payer: Self-pay

## 2017-02-27 NOTE — Telephone Encounter (Signed)
Cologuard paperwork with demographics and ins info prepared/will fax when TA signs/thx dmf

## 2017-03-01 ENCOUNTER — Encounter: Payer: No Typology Code available for payment source | Admitting: Family Medicine

## 2017-03-02 ENCOUNTER — Encounter: Payer: Self-pay | Admitting: Family Medicine

## 2017-03-23 ENCOUNTER — Encounter: Payer: No Typology Code available for payment source | Admitting: Family Medicine

## 2017-11-26 ENCOUNTER — Encounter: Payer: Self-pay | Admitting: Family Medicine

## 2017-12-02 ENCOUNTER — Encounter: Payer: Self-pay | Admitting: Family Medicine

## 2017-12-03 ENCOUNTER — Telehealth: Payer: Self-pay | Admitting: Family Medicine

## 2017-12-03 ENCOUNTER — Ambulatory Visit (INDEPENDENT_AMBULATORY_CARE_PROVIDER_SITE_OTHER): Payer: No Typology Code available for payment source

## 2017-12-03 ENCOUNTER — Encounter: Payer: Self-pay | Admitting: Family Medicine

## 2017-12-03 ENCOUNTER — Ambulatory Visit: Payer: No Typology Code available for payment source | Admitting: Family Medicine

## 2017-12-03 VITALS — BP 138/82 | Temp 99.0°F | Ht 69.5 in | Wt 214.0 lb

## 2017-12-03 DIAGNOSIS — M25562 Pain in left knee: Secondary | ICD-10-CM | POA: Diagnosis not present

## 2017-12-03 DIAGNOSIS — M25561 Pain in right knee: Secondary | ICD-10-CM | POA: Diagnosis not present

## 2017-12-03 MED ORDER — CYCLOBENZAPRINE HCL 10 MG PO TABS: 10.0000 mg | ORAL_TABLET | Freq: Every day | ORAL | 0 refills | Status: DC

## 2017-12-03 MED ORDER — DICLOFENAC SODIUM 1 % TD GEL: 2.0000 g | Freq: Four times a day (QID) | TRANSDERMAL | 0 refills | Status: DC

## 2017-12-03 NOTE — Telephone Encounter (Signed)
Copied from [place] [phone]. Topic: Quick Communication - See Telephone Encounter >> Dec 03, 2017  3:58 PM Sheran Luz wrote: CRM for notification. See Telephone encounter for: 12/03/17.  Pt called to check the status of her left knee xray. Pt states that she was contacted and the right knee was discussed but was not informed of results of left knee. Pt is requesting a call back to discuss.

## 2017-12-03 NOTE — Telephone Encounter (Signed)
Charted in result notes. 

## 2017-12-03 NOTE — Progress Notes (Signed)
Subjective:  Patient ID: Denise Haas, female    DOB: 1946/02/12  Age: 72 y.o. MRN: [id number]  CC: Leg Pain (1 month)   HPI EVONNE RINKS presents for 3-week history of bilateral left greater than right knee pain.  This seemed to start after she had her husband and taken a [place] cruise that involved a great deal of [place] at [place].  Left knee is swollen.  There is some pain radiating from the knees down the front of each leg.  There is been no locking or instability in her knees.  She can recall no prior injury history of her knees.  She has been taking ibuprofen and Tylenol without much relief.  She is trying not to take too much ibuprofen to avoid a rash.  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  Outpatient Medications Prior to Visit  Medication Sig Dispense Refill  . Calcium Carb-Cholecalciferol (CALCIUM + D3) 600-200 MG-UNIT TABS Take 1 tablet by mouth 2 (two) times daily.    . Calcium Carbonate-Vitamin D (CALCIUM HIGH POTENCY/VITAMIN D) 600-200 MG-UNIT TABS Take by mouth.    . Cholecalciferol (VITAMIN D) 2000 units tablet Take by mouth.    . Cyanocobalamin (B-12) 2500 MCG TABS Take 1 tablet by mouth daily.    Marland Kitchen LORazepam (ATIVAN) 0.5 MG tablet Take 1 by mouth one hour prior to procedure.    . Melaton-Thean-Cham-PassF-LBalm (SLEEP) CAPS Take by mouth.    . Melatonin 2.5 MG CAPS Take 1 capsule by mouth Nightly.    . Misc Natural Products (TART CHERRY ADVANCED PO) Take 1,200 mg by mouth daily.    . Multiple Vitamin (MULTIVITAMIN) capsule Take 1 capsule by mouth daily.    . Biotin 300 MCG TABS Take by mouth.    . Estradiol (VAGIFEM) 10 MCG TABS vaginal tablet Place vaginally.    Marland Kitchen estrogens, conjugated, (PREMARIN) 0.625 MG tablet Place vaginally.    . folic acid (FOLVITE) 528 MCG tablet Take 800 mcg by mouth daily.    Marland Kitchen gabapentin (NEURONTIN) 300 MG capsule Take by mouth.    . Influenza vac split quadrivalent PF (FLUZONE HIGH-DOSE) 0.5 ML injection      . latanoprost (XALATAN) 0.005 % ophthalmic solution     . nystatin-triamcinolone (MYCOLOG II) cream Apply topically.    Marland Kitchen OVER THE COUNTER MEDICATION Hylaurionic Acid For arthritis    . Polyvinyl Alcohol-Povidone (REFRESH OP) Apply to eye.    . Probiotic Product (PROBIOTIC DAILY PO) Take by mouth.    . ranitidine (ZANTAC) 150 MG tablet Take by mouth.    . Riboflavin (VITAMIN B2 PO) Take by mouth.    . Risedronate Sodium 35 MG TBEC Take by mouth.    . traMADol (ULTRAM) 50 MG tablet Take 1 tablet (50 mg total) by mouth 2 (two) times daily as needed. (Patient not taking: Reported on 12/03/2017) 180 tablet 0  . tretinoin (RETIN-A) 0.025 % cream Apply topically at bedtime. (Patient not taking: Reported on 12/03/2017) 135 g 3   No facility-administered medications prior to visit.     ROS Review of Systems  Constitutional: Negative for fatigue, fever and unexpected weight change.  Respiratory: Negative.   Cardiovascular: Negative.   Gastrointestinal: Negative.   Musculoskeletal: Positive for arthralgias and gait problem.  Skin: Negative for pallor and rash.  Hematological: Negative.   Psychiatric/Behavioral: Negative.     Objective:  BP 138/82 (BP Location: Left Arm, Patient Position: Sitting, Cuff Size: Normal)   Temp 99  F (37.2 C) (Oral)   Ht 5' 9.5" (1.765 m)   Wt 214 lb (97.1 kg)   SpO2 96%   BMI 31.15 kg/m   BP Readings from Last 3 Encounters:  12/03/17 138/82  02/22/17 138/78  02/15/17 136/88    Wt Readings from Last 3 Encounters:  12/03/17 214 lb (97.1 kg)  02/22/17 209 lb 12.8 oz (95.2 kg)  02/15/17 209 lb 6.4 oz (95 kg)    Physical Exam  Constitutional: She appears well-developed and well-nourished. No distress.  HENT:  Head: Normocephalic and atraumatic.  Right Ear: External ear normal.  Left Ear: External ear normal.  Eyes: Right eye exhibits no discharge. Left eye exhibits no discharge. No scleral icterus.  Cardiovascular:  Pulses:      Dorsalis pedis  pulses are 2+ on the right side, and 2+ on the left side.       Posterior tibial pulses are 1+ on the right side, and 2+ on the left side.  Pulmonary/Chest: Effort normal.  Musculoskeletal: She exhibits no edema.       Right knee: She exhibits normal range of motion, no swelling and no effusion. Tenderness found. Medial joint line tenderness noted.       Left knee: She exhibits swelling. She exhibits normal range of motion, no effusion and no erythema. Tenderness found. Medial joint line tenderness noted.       Right lower leg: She exhibits no tenderness, no bony tenderness and no edema.       Left lower leg: She exhibits no tenderness, no swelling and no edema.  Skin: No rash noted. She is not diaphoretic. No erythema.  Psychiatric: She has a normal mood and affect. Her behavior is normal.    Lab Results  Component Value Date   WBC 5.9 02/22/2017   HGB 13.3 02/22/2017   HCT 38.9 02/22/2017   PLT 268 02/22/2017   GLUCOSE 106 (H) 02/22/2017   CHOL 201 (H) 02/22/2017   TRIG 110 02/22/2017   HDL 70 02/22/2017   LDLCALC 109 (H) 02/22/2017   ALT 22 02/22/2017   AST 20 02/22/2017   NA 139 02/22/2017   K 4.6 02/22/2017   CL 105 02/22/2017   CREATININE 0.74 02/22/2017   BUN 14 02/22/2017   CO2 23 02/22/2017   TSH 1.49 02/22/2017    Dg Cervical Spine Complete  Result Date: 08/25/2014 CLINICAL DATA:  Neck pain after carrying heavy objects. EXAM: CERVICAL SPINE  4+ VIEWS COMPARISON:  None. FINDINGS: Diffuse degenerative change with prominent endplate osteophyte formation and disc space loss C5-C6 is C6-C7. Diffuse osteopenia. No evidence fracture or dislocation. Loss of normal cervical spine lordosis noted, most likely from degenerative disease. Multilevel mild bilateral neural foraminal narrowing secondary to degenerative change. IMPRESSION: No evidence of fracture or dislocation. 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  Electronically Signed   By: Marcello Moores  Register   On: 08/25/2014 11:31    Assessment & Plan:   Stevey was seen today for leg pain.  Diagnoses and all orders for this visit:  Acute pain of both knees -     diclofenac sodium (VOLTAREN) 1 % GEL; Apply 2 g topically 4 (four) times daily. -     cyclobenzaprine (FLEXERIL) 10 MG tablet; Take 1 tablet (10 mg total) by mouth at bedtime. -     DG Knee Complete 4 Views Left; Future -     DG Knee Complete 4 Views Right; Future  I am having [name]. [place] start on diclofenac sodium and cyclobenzaprine. I am also having her maintain her latanoprost, Calcium + D3, Melatonin, B-12, Probiotic Product (PROBIOTIC DAILY PO), Polyvinyl Alcohol-Povidone (REFRESH OP), Riboflavin (VITAMIN B2 PO), OVER THE COUNTER MEDICATION, multivitamin, Misc Natural Products (TART CHERRY ADVANCED PO), folic acid, traMADol, tretinoin, Biotin, Calcium Carbonate-Vitamin D, Vitamin D, Estradiol, estrogens (conjugated), gabapentin, Influenza vac split quadrivalent PF, LORazepam, SLEEP, nystatin-triamcinolone, ranitidine, and Risedronate Sodium.  Meds ordered this encounter  Medications  . diclofenac sodium (VOLTAREN) 1 % GEL    Sig: Apply 2 g topically 4 (four) times daily.    Dispense:  100 g    Refill:  0  . cyclobenzaprine (FLEXERIL) 10 MG tablet    Sig: Take 1 tablet (10 mg total) by mouth at bedtime.    Dispense:  30 tablet    Refill:  0   Topical Voltaren in lieu of ibuprofen.  Patient request muscle relaxer for sleep.  Follow-up: Return Follow up with Dr. Marjory Lies.Libby Maw, MD

## 2017-12-03 NOTE — Patient Instructions (Signed)

## 2017-12-04 NOTE — Telephone Encounter (Signed)
I called and spoke with patient & went over the results for both knees. Patient verbalized understanding.

## 2017-12-12 ENCOUNTER — Ambulatory Visit: Payer: No Typology Code available for payment source | Admitting: Nurse Practitioner

## 2017-12-12 ENCOUNTER — Encounter: Payer: Self-pay | Admitting: Nurse Practitioner

## 2017-12-12 VITALS — BP 148/86 | HR 86 | Temp 97.7°F | Ht 69.5 in | Wt 214.0 lb

## 2017-12-12 DIAGNOSIS — M25562 Pain in left knee: Secondary | ICD-10-CM | POA: Diagnosis not present

## 2017-12-12 DIAGNOSIS — M25462 Effusion, left knee: Secondary | ICD-10-CM | POA: Diagnosis not present

## 2017-12-12 MED ORDER — TRAMADOL HCL 50 MG PO TABS: 50.0000 mg | ORAL_TABLET | Freq: Two times a day (BID) | ORAL | 0 refills | Status: AC | PRN

## 2017-12-12 NOTE — Progress Notes (Signed)
Subjective:  Patient ID: Honor Loh, female    DOB: [birth year]  Age: 72 y.o. MRN: [id number]  CC: Knee Pain (left knee pain,swelling,hard to walk. pt saw Dr. Ethelene Hal 1 wk ago,x-ray normal--going on 1 wk. took ibuprofen, aleve and compression. )   Knee Pain   The incident occurred 12 to 24 hours ago. The incident occurred at the park. The injury mechanism was a twisting injury. The pain is present in the left knee. The quality of the pain is described as aching and cramping. The pain is severe. The pain has been constant since onset. Associated symptoms include an inability to bear weight. Pertinent negatives include no loss of sensation, muscle weakness, numbness or tingling. The symptoms are aggravated by weight bearing and movement. She has tried NSAIDs, non-weight bearing, rest, ice, immobilization and elevation for the symptoms. The treatment provided mild relief.  pain with climbing stair and knee flexion. Pain improves when extended and at rest. Minimal improvement with ibuprofen Has upcoming Cruise on Tuesday, and is concerned about her ability to walk.  Reviewed past Medical, Social and Family history today.  Outpatient Medications Prior to Visit  Medication Sig Dispense Refill  . Biotin 300 MCG TABS Take by mouth.    . Calcium Carb-Cholecalciferol (CALCIUM + D3) 600-200 MG-UNIT TABS Take 1 tablet by mouth 2 (two) times daily.    . Calcium Carbonate-Vitamin D (CALCIUM HIGH POTENCY/VITAMIN D) 600-200 MG-UNIT TABS Take by mouth.    . Cholecalciferol (VITAMIN D) 2000 units tablet Take by mouth.    . Cyanocobalamin (B-12) 2500 MCG TABS Take 1 tablet by mouth daily.    . cyclobenzaprine (FLEXERIL) 10 MG tablet Take 1 tablet (10 mg total) by mouth at bedtime. 30 tablet 0  . diclofenac sodium (VOLTAREN) 1 % GEL Apply 2 g topically 4 (four) times daily. 100 g 0  . Estradiol (VAGIFEM) 10 MCG TABS vaginal tablet Place vaginally.    . folic acid (FOLVITE) 299 MCG tablet Take 800 mcg by  mouth daily.    . Influenza vac split quadrivalent PF (FLUZONE HIGH-DOSE) 0.5 ML injection     . latanoprost (XALATAN) 0.005 % ophthalmic solution     . Melaton-Thean-Cham-PassF-LBalm (SLEEP) CAPS Take by mouth.    . Melatonin 2.5 MG CAPS Take 1 capsule by mouth Nightly.    . Misc Natural Products (TART CHERRY ADVANCED PO) Take 1,200 mg by mouth daily.    . Multiple Vitamin (MULTIVITAMIN) capsule Take 1 capsule by mouth daily.    Marland Kitchen nystatin-triamcinolone (MYCOLOG II) cream Apply topically.    Marland Kitchen OVER THE COUNTER MEDICATION Hylaurionic Acid For arthritis    . Polyvinyl Alcohol-Povidone (REFRESH OP) Apply to eye.    . Probiotic Product (PROBIOTIC DAILY PO) Take by mouth.    . Riboflavin (VITAMIN B2 PO) Take by mouth.    . Risedronate Sodium 35 MG TBEC Take by mouth.    . tretinoin (RETIN-A) 0.025 % cream Apply topically at bedtime. 135 g 3  . estrogens, conjugated, (PREMARIN) 0.625 MG tablet Place vaginally.    . gabapentin (NEURONTIN) 300 MG capsule Take by mouth.    Marland Kitchen LORazepam (ATIVAN) 0.5 MG tablet Take 1 by mouth one hour prior to procedure.    . ranitidine (ZANTAC) 150 MG tablet Take by mouth.    . traMADol (ULTRAM) 50 MG tablet Take 1 tablet (50 mg total) by mouth 2 (two) times daily as needed. (Patient not taking: Reported on 12/03/2017) 180 tablet 0   No facility-administered  medications prior to visit.     ROS See HPI  Objective:  BP (!) 148/86   Pulse 86   Temp 97.7 F (36.5 C) (Oral)   Ht 5' 9.5" (1.765 m)   Wt 214 lb (97.1 kg)   SpO2 97%   BMI 31.15 kg/m   BP Readings from Last 3 Encounters:  12/13/17 136/74  12/12/17 (!) 148/86  12/03/17 138/82    Wt Readings from Last 3 Encounters:  12/12/17 214 lb (97.1 kg)  12/03/17 214 lb (97.1 kg)  02/22/17 209 lb 12.8 oz (95.2 kg)    Physical Exam  Constitutional: She appears well-developed and well-nourished.  Cardiovascular: Normal rate.  Pulmonary/Chest: Effort normal.  Musculoskeletal: She exhibits edema and  tenderness. She exhibits no deformity.       Left knee: She exhibits decreased range of motion, effusion and bony tenderness. Tenderness found. Medial joint line and lateral joint line tenderness noted. No MCL, no LCL and no patellar tendon tenderness noted.  Skin: No erythema.  Vitals reviewed.   Lab Results  Component Value Date   WBC 5.9 02/22/2017   HGB 13.3 02/22/2017   HCT 38.9 02/22/2017   PLT 268 02/22/2017   GLUCOSE 106 (H) 02/22/2017   CHOL 201 (H) 02/22/2017   TRIG 110 02/22/2017   HDL 70 02/22/2017   LDLCALC 109 (H) 02/22/2017   ALT 22 02/22/2017   AST 20 02/22/2017   NA 139 02/22/2017   K 4.6 02/22/2017   CL 105 02/22/2017   CREATININE 0.74 02/22/2017   BUN 14 02/22/2017   CO2 23 02/22/2017   TSH 1.49 02/22/2017    Dg Cervical Spine Complete  Result Date: 08/25/2014 CLINICAL DATA:  Neck pain after carrying heavy objects. EXAM: CERVICAL SPINE  4+ VIEWS COMPARISON:  None. FINDINGS: Diffuse degenerative change with prominent endplate osteophyte formation and disc space loss C5-C6 is C6-C7. Diffuse osteopenia. No evidence fracture or dislocation. Loss of normal cervical spine lordosis noted, most likely from degenerative disease. Multilevel mild bilateral neural foraminal narrowing secondary to degenerative change. IMPRESSION: No evidence of fracture or dislocation. 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 Electronically Signed   By: Marcello Moores  Register   On: 08/25/2014 11:31    Assessment & Plan:   Kaegan was seen today for knee pain.  Diagnoses and all orders for this visit:  Pain and swelling of left knee -     traMADol (ULTRAM) 50 MG tablet; Take 1 tablet (50 mg total) by mouth every 12 (twelve) hours as needed for up to 3 days.  Acute pain of left knee -     traMADol (ULTRAM) 50 MG tablet; Take 1 tablet (50 mg total) by mouth every 12 (twelve) hours as needed for up to 3 days.   I have changed [name]. Vore's traMADol. I am also having her maintain her latanoprost, Calcium + D3, Melatonin, B-12, Probiotic Product (PROBIOTIC DAILY PO), Polyvinyl Alcohol-Povidone (REFRESH OP), Riboflavin (VITAMIN B2 PO), OVER THE COUNTER MEDICATION, multivitamin, Misc Natural Products (TART CHERRY ADVANCED PO), folic acid, tretinoin, Biotin, Calcium Carbonate-Vitamin D, Vitamin D, Estradiol, Influenza vac split quadrivalent PF, SLEEP, nystatin-triamcinolone, Risedronate Sodium, diclofenac sodium, and cyclobenzaprine.  Meds ordered this encounter  Medications  . traMADol (ULTRAM) 50 MG tablet    Sig: Take 1 tablet (50 mg total) by mouth every 12 (twelve) hours as needed for up to 3 days.    Dispense:  6 tablet    Refill:  0    Order Specific Question:   Supervising Provider    Answer:   Lucille Passy [[id number]]    Follow-up: Return if symptoms worsen or fail to improve.  Wilfred Lacy, NP

## 2017-12-12 NOTE — Patient Instructions (Addendum)
Continue with cold compress  Maintain appt with Dr. Raeford Razor  Alternate between ibuprofen and tramadol for pain.

## 2017-12-13 ENCOUNTER — Encounter: Payer: Self-pay | Admitting: Nurse Practitioner

## 2017-12-13 ENCOUNTER — Ambulatory Visit: Payer: No Typology Code available for payment source | Admitting: Family Medicine

## 2017-12-13 ENCOUNTER — Encounter: Payer: Self-pay | Admitting: Family Medicine

## 2017-12-13 ENCOUNTER — Ambulatory Visit (INDEPENDENT_AMBULATORY_CARE_PROVIDER_SITE_OTHER): Payer: No Typology Code available for payment source

## 2017-12-13 VITALS — BP 136/74 | HR 86 | Ht 69.5 in

## 2017-12-13 DIAGNOSIS — M25462 Effusion, left knee: Secondary | ICD-10-CM

## 2017-12-13 DIAGNOSIS — M25562 Pain in left knee: Secondary | ICD-10-CM

## 2017-12-13 DIAGNOSIS — M25561 Pain in right knee: Secondary | ICD-10-CM

## 2017-12-13 MED ORDER — PREDNISONE 5 MG PO TABS: ORAL_TABLET | ORAL | 0 refills | Status: DC

## 2017-12-13 NOTE — Progress Notes (Signed)
Denise Haas - 72 y.o. female MRN [id number]  Date of birth: [birth year]  SUBJECTIVE:  Including CC & ROS.  Chief Complaint  Patient presents with  . Knee Pain    Denise Haas is a 72 y.o. female that is here today for bilateral knee pain.  Pain has been ongoing for two weeks. She recalls walking down a hill and felt a pop in her left knee. Pain is located throughout her knee, pain is not localized. Admits to swelling. Denies redness or warmth.  She has been applying ice and taking motrin. She has been wearing an ACE bandage daily. Pain is mild to severe during flexion, difficulty walking. Denies prior surgeries. Pain is worse with walking.   She traveled to [place] in August, she did a lot of walking. She noticed her legs were more tired and achy after returning back home.   Independent review of the right and left knee x-ray from 9/23 shows mild degenerative changes in the medial compartment.  Review of Systems  Constitutional: Negative for fever.  HENT: Negative for congestion.   Respiratory: Negative for cough.   Cardiovascular: Negative for chest pain.  Gastrointestinal: Negative for abdominal pain.  Musculoskeletal: Positive for gait problem.  Skin: Negative for color change.  Neurological: Negative for weakness.  Hematological: Negative for adenopathy.  Psychiatric/Behavioral: Negative for agitation.    HISTORY: Past Medical, Surgical, Social, and Family History Reviewed & Updated per EMR.   Pertinent Historical Findings include:  Past Medical History:  Diagnosis Date  . Abnormal liver function test 02/20/2015  . Acne   . Acute bronchitis 02/20/2015  . Arthritis   . Cataract   . GERD (gastroesophageal reflux disease)   . Glaucoma   . H/O measles   . H/O mumps   . History of chicken pox   . History of colonic polyps   . History of colonic polyps 02/21/2016  . Neuromuscular disorder ([place])   . Osteopenia   . Seborrhea capitis 08/28/2016    Past Surgical History:    Procedure Laterality Date  . [place]   cage placed in lumbar  . BREAST SURGERY     benign left breast biopsy for cyst  . EYE SURGERY     cataracts b/l    Allergies  Allergen Reactions  . Amoxicillin Hives  . Aspirin Hives  . Erythromycin Hives and Rash  . Ibuprofen Hives  . Latex Rash  . Tetracyclines & Related Hives  . Triple Antibiotic [Bacitracin-Neomycin-Polymyxin] Rash    Family History  Problem Relation Age of Onset  . Cancer Mother        breast  . Heart disease Mother   . Diabetes Mother   . Asthma Mother   . Hyperlipidemia Mother   . Peripheral vascular disease Mother   . COPD Father   . Heart disease Father   . Arthritis Father   . Stroke Maternal Grandmother   . Heart disease Maternal Grandmother   . Heart disease Maternal Grandfather   . Heart attack Maternal Grandfather   . Cancer Paternal Grandfather        throat  . Glaucoma Sister   . Glaucoma Brother   . Heart disease Brother   . Alcohol abuse Brother   . Depression Brother   . Hyperlipidemia Brother   . COPD Daughter   . Glaucoma Brother   . Cancer Brother        skin cancer, lost tip of thumb  . Glaucoma  Brother   . Crohn's disease Brother        colostomy in place  . Cancer Cousin        colon   . Colon cancer Cousin      Social History   Socioeconomic History  . Marital status: Married    Spouse name: Not on file  . Number of children: Not on file  . Years of education: Not on file  . Highest education level: Not on file  Occupational History  . Not on file  Social Needs  . Financial resource strain: Not on file  . Food insecurity:    Worry: Not on file    Inability: Not on file  . Transportation needs:    Medical: Not on file    Non-medical: Not on file  Tobacco Use  . Smoking status: Never Smoker  . Smokeless tobacco: Never Used  Substance and Sexual Activity  . Alcohol use: Yes    Alcohol/week: 14.0 standard drinks    Types: 14 Glasses of wine per  week  . Drug use: No  . 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 no dietary restrictions. wears seat belt  Lifestyle  . Physical activity:    Days per week: Not on file    Minutes per session: Not on file  . Stress: Not on file  Relationships  . Social connections:    Talks on phone: Not on file    Gets together: Not on file    Attends religious service: Not on file    Active member of club or organization: Not on file    Attends meetings of clubs or organizations: Not on file    Relationship status: Not on file  . Intimate partner violence:    Fear of current or ex partner: Not on file    Emotionally abused: Not on file    Physically abused: Not on file    Forced sexual activity: Not on file  Other Topics Concern  . Not on file  Social History Narrative  . Not on file     PHYSICAL EXAM:  VS: BP 136/74   Pulse 86   Ht 5' 9.5" (1.765 m)   SpO2 98%   BMI 31.15 kg/m  Physical Exam Gen: NAD, alert, cooperative with exam, well-appearing ENT: normal lips, normal nasal mucosa,  Eye: normal EOM, normal conjunctiva and lids CV:  no edema, +2 pedal pulses   Resp: no accessory muscle use, non-labored,  Skin: no rashes, no areas of induration  Neuro: normal tone, normal sensation to touch Psych:  normal insight, alert and oriented MSK:  Right and left knee:  No effusion  Normal ROM  Normal strength to resistance Weakness with hip abduction  No instability  TTP with patella grind  Neurovascularly intact   Limited ultrasound: right knee/left knee:  Right knee:  No significant effusion  Normal appearing QT and PT  Medial joint space narrowing with mild outpouching of medial meniscus   Left knee:  Mild effusion  Normal appearing QT and PT  Medial joint space narrowing with outpouching of medial meniscus   Summary: mild degenerative changes b/l   Ultrasound and interpretation by Clearance Coots, MD      ASSESSMENT & PLAN:    Acute pain of both knees 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 Mild degenerative changes would could be contributing as well.  - prednisone  - samples of vimovo and pennsaid provided  - counseled on  HEP  - counseled on supportive care - if no improvement consider injections.

## 2017-12-13 NOTE — Assessment & Plan Note (Signed)
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 Mild degenerative changes would could be contributing as well.  - prednisone  - samples of vimovo and pennsaid provided  - counseled on HEP  - counseled on supportive care - if no improvement consider injections.

## 2017-12-13 NOTE — Patient Instructions (Signed)
Nice to meet you  Please try the medicine  Please try the exercises  Please try ice on the knees  Please try to limit the amount of stairs you go up or down  Please see me back if your pain isn't improved after 4-6 weeks.

## 2018-02-25 ENCOUNTER — Encounter: Payer: No Typology Code available for payment source | Admitting: Family Medicine

## 2018-02-27 ENCOUNTER — Ambulatory Visit: Payer: No Typology Code available for payment source | Admitting: Family Medicine

## 2018-02-27 ENCOUNTER — Encounter: Payer: Self-pay | Admitting: Family Medicine

## 2018-02-27 VITALS — BP 134/72 | HR 83 | Temp 99.3°F | Ht 69.5 in | Wt 220.2 lb

## 2018-02-27 DIAGNOSIS — E782 Mixed hyperlipidemia: Secondary | ICD-10-CM | POA: Diagnosis not present

## 2018-02-27 DIAGNOSIS — R350 Frequency of micturition: Secondary | ICD-10-CM | POA: Diagnosis not present

## 2018-02-27 DIAGNOSIS — Z0000 Encounter for general adult medical examination without abnormal findings: Secondary | ICD-10-CM | POA: Diagnosis not present

## 2018-02-27 DIAGNOSIS — M8580 Other specified disorders of bone density and structure, unspecified site: Secondary | ICD-10-CM | POA: Diagnosis not present

## 2018-02-27 DIAGNOSIS — D229 Melanocytic nevi, unspecified: Secondary | ICD-10-CM

## 2018-02-27 MED ORDER — TRETINOIN 0.025 % EX CREA: TOPICAL_CREAM | Freq: Every day | CUTANEOUS | 3 refills | Status: DC

## 2018-02-27 NOTE — Progress Notes (Signed)
0 0

## 2018-02-27 NOTE — Assessment & Plan Note (Addendum)
Reviewed preventive care protocols, scheduled due services, and updated immunizations Discussed nutrition, exercise, diet, and healthy lifestyle.  Cologuard and labs ordered.

## 2018-02-27 NOTE — Progress Notes (Signed)
Subjective:   Patient ID: Denise Haas, female    DOB: [birth year], 72 y.o.   MRN: [id number]  Denise Haas is a pleasant 72 y.o. year old female who presents to clinic today with Follow-up (Patient is here today for a F/U.  She is fasting.  She is due for a Colonoscopy and will offer Cologuard.  Last Mammogram 2.1.18 per chart but do not see report.  Last BMD scan was 3.25.14 at [facility] which showed Osteopenia.  Both Mammogram and BMD are UTD by Denise Haas at [facility] as last BMD showed Osteopenia in 2014.  She agrees to [employer].  [facility]  on 02/27/2018  HPI:  Due for colonoscopy.  She is willing to try Cologuard.   Has GYN, Dr. Orvan Haas.  Denies any post menopausal bleeding. Per pt, both mammogram and BMD done at her office and are UTD- was Haas in 04/2017.  Had yearly skin survey 2 months ago.  Has several nevi she feels are changing.Marland Kitchen  Health Maintenance  Topic Date Due  . Fecal DNA (Cologuard)  04/21/1996  . MAMMOGRAM  04/13/2018  . TETANUS/TDAP  12/04/2027  . INFLUENZA VACCINE  Completed  . DEXA SCAN  Completed  . Hepatitis C Screening  Completed  . PNA vac Low Risk Adult  Completed   Depression screen [facility] 2/9 02/27/2018 02/22/2017 02/21/2016 02/16/2015 02/03/2014  Decreased Interest 0 0 0 0 0  Down, Depressed, Hopeless 0 0 0 0 0  PHQ - 2 Score 0 0 0 0 0  Altered sleeping 0 - - - -  Tired, decreased energy 0 - - - -  Change in appetite 1 - - - -  Feeling bad or failure about yourself  0 - - - -  Trouble concentrating 0 - - - -  Moving slowly or fidgety/restless 0 - - - -  Suicidal thoughts 0 - - - -  PHQ-9 Score 1 - - - -  Difficult doing work/chores Not difficult at all - - - -   GAD 7 : Generalized Anxiety Score 02/27/2018  Nervous, Anxious, on Edge 1  Control/stop worrying 1  Worry too much - different things 1  Trouble relaxing 0  Restless 0  Easily annoyed or irritable 0  Afraid - awful might happen 1    Total GAD 7 Score 4  Anxiety Difficulty Somewhat difficult     Current Outpatient Medications on File Prior to Visit  Medication Sig Dispense Refill  . Biotin 300 MCG TABS Take by mouth.    . Calcium Carb-Cholecalciferol (CALCIUM + D3) 600-200 MG-UNIT TABS Take 1 tablet by mouth 2 (two) times daily.    . Cholecalciferol (VITAMIN D) 2000 units tablet Take by mouth.    . Cyanocobalamin (B-12) 2500 MCG TABS Take 1 tablet by mouth daily.    . cyclobenzaprine (FLEXERIL) 10 MG tablet Take 1 tablet (10 mg total) by mouth at bedtime. 30 tablet 0  . diclofenac sodium (VOLTAREN) 1 % GEL Apply 2 g topically 4 (four) times daily. 671 g 0  . folic acid (FOLVITE) 245 MCG tablet Take 800 mcg by mouth daily.    Marland Kitchen latanoprost (XALATAN) 0.005 % ophthalmic solution     . Melaton-Thean-Cham-PassF-LBalm (SLEEP) CAPS Take by mouth.    . Melatonin 2.5 MG CAPS Take 1 capsule by mouth Nightly.    . Misc Natural Products (TART CHERRY ADVANCED PO) Take 1,200 mg by mouth daily.    Marland Kitchen  Multiple Vitamin (MULTIVITAMIN) capsule Take 1 capsule by mouth daily.    Marland Kitchen nystatin-triamcinolone (MYCOLOG II) cream Apply topically.    Marland Kitchen OVER THE COUNTER MEDICATION Hylaurionic Acid For arthritis    . Polyvinyl Alcohol-Povidone (REFRESH OP) Apply to eye.    . Probiotic Product (PROBIOTIC DAILY PO) Take by mouth.    . Riboflavin (VITAMIN B2 PO) Take by mouth.    . Risedronate Sodium 35 MG TBEC Take by mouth.     No current facility-administered medications on file prior to visit.     Allergies  Allergen Reactions  . Amoxicillin Hives  . Aspirin Hives  . Erythromycin Hives and Rash  . Ibuprofen Hives  . Latex Rash  . Tetracyclines & Related Hives  . Triple Antibiotic [Bacitracin-Neomycin-Polymyxin] Rash    Past Medical History:  Diagnosis Date  . Abnormal liver function test 02/20/2015  . Acne   . Acute bronchitis 02/20/2015  . Arthritis   . Cataract   . GERD (gastroesophageal reflux disease)   . Glaucoma   . H/O  measles   . H/O mumps   . History of chicken pox   . History of colonic polyps   . History of colonic polyps 02/21/2016  . Neuromuscular disorder ([address])   . Osteopenia   . Seborrhea capitis 08/28/2016    Past Surgical History:  Procedure Laterality Date  . [place]   cage placed in lumbar  . BREAST SURGERY     benign left breast biopsy for cyst  . EYE SURGERY     cataracts b/l    Family History  Problem Relation Age of Onset  . Cancer Mother        breast  . Heart disease Mother   . Diabetes Mother   . Asthma Mother   . Hyperlipidemia Mother   . Peripheral vascular disease Mother   . COPD Father   . Heart disease Father   . Arthritis Father   . Stroke Maternal Grandmother   . Heart disease Maternal Grandmother   . Heart disease Maternal Grandfather   . Heart attack Maternal Grandfather   . Cancer Paternal Grandfather        throat  . Glaucoma Sister   . Glaucoma Brother   . Heart disease Brother   . Alcohol abuse Brother   . Depression Brother   . Hyperlipidemia Brother   . COPD Daughter   . Glaucoma Brother   . Cancer Brother        skin cancer, lost tip of thumb  . Glaucoma Brother   . Crohn's disease Brother        colostomy in place  . Cancer Cousin        colon   . Colon cancer Cousin     Social History   Socioeconomic History  . Marital status: Married    Spouse name: Not on file  . Number of children: Not on file  . Years of education: Not on file  . Highest education level: Not on file  Occupational History  . Not on file  Social Needs  . Financial resource strain: Not on file  . Food insecurity:    Worry: Not on file    Inability: Not on file  . Transportation needs:    Medical: Not on file    Non-medical: Not on file  Tobacco Use  . Smoking status: Never Smoker  . Smokeless tobacco: Never Used  Substance and Sexual Activity  . Alcohol use:  Yes    Alcohol/week: 14.0 standard drinks    Types: 14 Glasses of wine per week    . Drug use: No  . 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 no dietary restrictions. wears seat belt  Lifestyle  . Physical activity:    Days per week: Not on file    Minutes per session: Not on file  . Stress: Not on file  Relationships  . Social connections:    Talks on phone: Not on file    Gets together: Not on file    Attends religious service: Not on file    Active member of club or organization: Not on file    Attends meetings of clubs or organizations: Not on file    Relationship status: Not on file  . Intimate partner violence:    Fear of current or ex partner: Not on file    Emotionally abused: Not on file    Physically abused: Not on file    Forced sexual activity: Not on file  Other Topics Concern  . Not on file  Social History Narrative  . Not on file   The PMH, PSH, Social History, Family History, Medications, and allergies have been reviewed in [facility], and have been updated if relevant.  Review of Systems  Constitutional: Negative.   HENT: Negative.   Eyes: Negative.   Respiratory: Negative.   Cardiovascular: Negative.   Gastrointestinal: Negative.   Endocrine: Negative.   Genitourinary: Negative.   Musculoskeletal: Negative.   Skin: Negative.   Allergic/Immunologic: Negative.   Neurological: Negative.   Hematological: Negative.   Psychiatric/Behavioral: Negative.   All other systems reviewed and are negative.      Objective:    BP 134/72 (BP Location: Left Arm, Patient Position: Sitting, Cuff Size: Normal)   Pulse 83   Temp 99.3 F (37.4 C) (Oral)   Ht 5' 9.5" (1.765 m)   Wt 220 lb 3.2 oz (99.9 kg)   SpO2 96%   BMI 32.05 kg/m    Physical Exam    General:  Well-developed,well-nourished,in no acute distress; alert,appropriate and cooperative throughout examination Head:  normocephalic and atraumatic.   Eyes:  vision grossly intact, PERRL Ears:  R ear normal and L ear normal externally, TMs clear  bilaterally Nose:  no external deformity.   Mouth:  good dentition.   Neck:  No deformities, masses, or tenderness noted. Breasts:  No mass, nodules, thickening, tenderness, bulging, retraction, inflamation, nipple discharge or skin changes noted.   Lungs:  Normal respiratory effort, chest expands symmetrically. Lungs are clear to auscultation, no crackles or wheezes. Heart:  Normal rate and regular rhythm. S1 and S2 normal without gallop, murmur, click, rub or other extra sounds. Abdomen:  Bowel sounds positive,abdomen soft and non-tender without masses, organomegaly or hernias noted. Msk:  No deformity or scoliosis noted of thoracic or lumbar spine.   Extremities:  No clubbing, cyanosis, edema, or deformity noted with normal full range of motion of all joints.   Neurologic:  alert & oriented X3 and gait normal.   Skin:  Intact without suspicious lesions or rashes Cervical Nodes:  No lymphadenopathy noted Axillary Nodes:  No palpable lymphadenopathy Psych:  Cognition and judgment appear intact. Alert and cooperative with normal attention span and concentration. No apparent delusions, illusions, hallucinations        Assessment & Plan:   Hyperlipidemia, mixed - Plan: CBC w/Diff, Comp Met (CMET), Lipid Profile, TSH  Osteopenia, unspecified location - Plan:  VITAMIN D 25 Hydroxy (Vit-D Deficiency, Fractures)  Urinary frequency - Plan: Urinalysis, Routine w reflex microscopic  Well woman exam without gynecological exam  Change in multiple nevi - Plan: Ambulatory referral to Dermatology No follow-ups on file.

## 2018-02-27 NOTE — Patient Instructions (Addendum)
Great to see you. I will call you with your lab results from today and you can view them online.   I hope you have a nice holiday.  We are referring you to [facility].  Please sign request for recent Mammogram and BMD Scan from [facility] :)

## 2018-02-28 LAB — URINALYSIS, ROUTINE W REFLEX MICROSCOPIC
Bacteria, UA: NONE SEEN /HPF
Bilirubin Urine: NEGATIVE
Glucose, UA: NEGATIVE
Hgb urine dipstick: NEGATIVE
Hyaline Cast: NONE SEEN /LPF
KETONES UR: NEGATIVE
Nitrite: NEGATIVE
Protein, ur: NEGATIVE
RBC / HPF: NONE SEEN /HPF (ref 0–2)
SPECIFIC GRAVITY, URINE: 1.016 (ref 1.001–1.03)
pH: 5.5 (ref 5.0–8.0)

## 2018-02-28 LAB — COMPREHENSIVE METABOLIC PANEL
AG Ratio: 1.4 (calc) (ref 1.0–2.5)
ALT: 22 U/L (ref 6–29)
AST: 22 U/L (ref 10–35)
Albumin: 4.1 g/dL (ref 3.6–5.1)
Alkaline phosphatase (APISO): 45 U/L (ref 33–130)
BUN: 11 mg/dL (ref 7–25)
CO2: 25 mmol/L (ref 20–32)
CREATININE: 0.79 mg/dL (ref 0.60–0.93)
Calcium: 9.7 mg/dL (ref 8.6–10.4)
Chloride: 104 mmol/L (ref 98–110)
GLOBULIN: 2.9 g/dL (ref 1.9–3.7)
GLUCOSE: 93 mg/dL (ref 65–99)
Potassium: 4.5 mmol/L (ref 3.5–5.3)
Sodium: 141 mmol/L (ref 135–146)
Total Bilirubin: 0.7 mg/dL (ref 0.2–1.2)
Total Protein: 7 g/dL (ref 6.1–8.1)

## 2018-02-28 LAB — LIPID PANEL
CHOL/HDL RATIO: 3.2 (calc) (ref <5.0)
CHOLESTEROL: 194 mg/dL (ref <200)
HDL: 61 mg/dL (ref >50)
LDL CHOLESTEROL (CALC): 107 mg/dL — AB
NON-HDL CHOLESTEROL (CALC): 133 mg/dL — AB (ref <130)
TRIGLYCERIDES: 144 mg/dL (ref <150)

## 2018-02-28 LAB — CBC WITH DIFFERENTIAL/PLATELET
Absolute Monocytes: 360 cells/uL (ref 200–950)
BASOS PCT: 0.5 %
Basophils Absolute: 29 cells/uL (ref 0–200)
EOS ABS: 151 {cells}/uL (ref 15–500)
EOS PCT: 2.6 %
HCT: 40.4 % (ref 35.0–45.0)
HEMOGLOBIN: 13.6 g/dL (ref 11.7–15.5)
Lymphs Abs: 2233 cells/uL (ref 850–3900)
MCH: 31.7 pg (ref 27.0–33.0)
MCHC: 33.7 g/dL (ref 32.0–36.0)
MCV: 94.2 fL (ref 80.0–100.0)
MONOS PCT: 6.2 %
MPV: 11 fL (ref 7.5–12.5)
NEUTROS ABS: 3028 {cells}/uL (ref 1500–7800)
Neutrophils Relative %: 52.2 %
PLATELETS: 302 10*3/uL (ref 140–400)
RBC: 4.29 10*6/uL (ref 3.80–5.10)
RDW: 12.7 % (ref 11.0–15.0)
TOTAL LYMPHOCYTE: 38.5 %
WBC: 5.8 10*3/uL (ref 3.8–10.8)

## 2018-02-28 LAB — TSH: TSH: 0.99 mIU/L (ref 0.40–4.50)

## 2018-02-28 LAB — VITAMIN D 25 HYDROXY (VIT D DEFICIENCY, FRACTURES): VIT D 25 HYDROXY: 34 ng/mL (ref 30–100)

## 2018-03-08 LAB — COLOGUARD: Cologuard: POSITIVE

## 2018-03-14 ENCOUNTER — Telehealth: Payer: Self-pay

## 2018-03-14 DIAGNOSIS — R195 Other fecal abnormalities: Secondary | ICD-10-CM

## 2018-03-14 NOTE — Telephone Encounter (Signed)
Copied from [place] [id number]. Topic: General - Other >> Mar 14, 2018  3:16 PM Yvette Rack wrote: Reason for CRM: Jodie with [employer] stated that an abnormal cologuard was faxed on 03/11/18. Jodie requests call back. Cb# [phone]

## 2018-03-15 NOTE — Telephone Encounter (Signed)
TA-I made pt aware of positive Cologuard results/she is in agreement with referral to GI/she does have the following stipulations to scheduling which will need to be noted in the referral  She will be out of town the following dates: 1.9.20-1.13.20 1.26.20-2.5.20  Plz advise/thx dmf

## 2018-03-15 NOTE — Addendum Note (Signed)
Addended by: Lucille Passy on: 03/15/2018 12:02 PM   Modules accepted: Orders

## 2018-03-15 NOTE — Telephone Encounter (Signed)
Referral placed.

## 2018-03-18 ENCOUNTER — Encounter: Payer: Self-pay | Admitting: Family Medicine

## 2018-03-19 ENCOUNTER — Encounter: Payer: Self-pay | Admitting: Gastroenterology

## 2018-03-20 ENCOUNTER — Encounter: Payer: Self-pay | Admitting: Family Medicine

## 2018-03-20 DIAGNOSIS — R195 Other fecal abnormalities: Secondary | ICD-10-CM | POA: Insufficient documentation

## 2018-03-22 ENCOUNTER — Encounter: Payer: Self-pay | Admitting: Family Medicine

## 2018-03-26 ENCOUNTER — Encounter: Payer: Self-pay | Admitting: Nurse Practitioner

## 2018-03-26 ENCOUNTER — Ambulatory Visit: Payer: No Typology Code available for payment source | Admitting: Nurse Practitioner

## 2018-03-26 VITALS — BP 126/80 | HR 85 | Temp 98.9°F | Ht 69.5 in | Wt 219.8 lb

## 2018-03-26 DIAGNOSIS — J00 Acute nasopharyngitis [common cold]: Secondary | ICD-10-CM | POA: Diagnosis not present

## 2018-03-26 MED ORDER — OXYMETAZOLINE HCL 0.05 % NA SOLN: 1.0000 | Freq: Two times a day (BID) | NASAL | 0 refills | Status: DC

## 2018-03-26 MED ORDER — METHYLPREDNISOLONE ACETATE 40 MG/ML IJ SUSP
40.0000 mg | Freq: Once | INTRAMUSCULAR | Status: AC
  Administered 2018-03-26: 40 mg via INTRAMUSCULAR

## 2018-03-26 MED ORDER — SALINE SPRAY 0.65 % NA SOLN: 1.0000 | NASAL | 0 refills | Status: DC | PRN

## 2018-03-26 MED ORDER — FLUTICASONE PROPIONATE 50 MCG/ACT NA SUSP: 2.0000 | Freq: Every day | NASAL | 0 refills | Status: DC

## 2018-03-26 MED ORDER — DM-GUAIFENESIN ER 30-600 MG PO TB12: 1.0000 | ORAL_TABLET | Freq: Two times a day (BID) | ORAL | 0 refills | Status: DC | PRN

## 2018-03-26 MED ORDER — HYDROCODONE-HOMATROPINE 5-1.5 MG/5ML PO SYRP: 5.0000 mL | ORAL_SOLUTION | Freq: Two times a day (BID) | ORAL | 0 refills | Status: DC | PRN

## 2018-03-26 MED ORDER — ALBUTEROL SULFATE HFA 108 (90 BASE) MCG/ACT IN AERS: 1.0000 | INHALATION_SPRAY | Freq: Four times a day (QID) | RESPIRATORY_TRACT | 0 refills | Status: DC | PRN

## 2018-03-26 NOTE — Patient Instructions (Signed)
URI Instructions: Flonase and Afrin use: apply 1spray of afrin in each nare, wait 77mins, then apply 2sprays of flonase in each nare. Use both nasal spray consecutively x 3days, then flonase only for at least 14days.  Encourage adequate oral hydration.  Use over-the-counter  "cold" medicines  such as "Tylenol cold" , "Advil cold",  "Mucinex" or" Mucinex D"  for cough and congestion.  Avoid decongestants if you have high blood pressure. Use" Delsym" or" Robitussin" cough syrup varietis for cough.  You can use plain "Tylenol" or "Advi"l for fever, chills and achyness.   "Common cold" symptoms are usually triggered by a virus.  The antibiotics are usually not necessary. On average, a" viral cold" illness would take 4-7 days to resolve. Please, make an appointment if you are not better or if you're worse.  Call office for additional medication if no improvement by Friday.

## 2018-03-26 NOTE — Progress Notes (Signed)
Subjective:  Patient ID: Denise Haas, female    DOB: 08-Apr-1945  Age: 73 y.o. MRN: [id number]  CC: Nasal Congestion (nasal congestion, cough-- non productive, both ears aching/ OTC sudafed and ibuprofen/ on way down to home on airplane her ears hurt real bad )  URI   This is a new problem. The current episode started yesterday. The problem has been gradually worsening. There has been no fever. Associated symptoms include congestion, coughing, ear pain, headaches, joint pain, a plugged ear sensation, rhinorrhea, sinus pain, sneezing and a sore throat. Pertinent negatives include no wheezing. She has tried decongestant and acetaminophen for the symptoms. The treatment provided mild relief.   Reviewed past Medical, Social and Family history today.  Outpatient Medications Prior to Visit  Medication Sig Dispense Refill  . Biotin 300 MCG TABS Take by mouth.    . Calcium Carb-Cholecalciferol (CALCIUM + D3) 600-200 MG-UNIT TABS Take 1 tablet by mouth 2 (two) times daily.    . Cholecalciferol (VITAMIN D) 2000 units tablet Take by mouth.    . Cyanocobalamin (B-12) 2500 MCG TABS Take 1 tablet by mouth daily.    . cyclobenzaprine (FLEXERIL) 10 MG tablet Take 1 tablet (10 mg total) by mouth at bedtime. 30 tablet 0  . diclofenac sodium (VOLTAREN) 1 % GEL Apply 2 g topically 4 (four) times daily. 628 g 0  . folic acid (FOLVITE) 366 MCG tablet Take 800 mcg by mouth daily.    Marland Kitchen latanoprost (XALATAN) 0.005 % ophthalmic solution     . Melaton-Thean-Cham-PassF-LBalm (SLEEP) CAPS Take by mouth.    . Melatonin 2.5 MG CAPS Take 1 capsule by mouth Nightly.    . Misc Natural Products (TART CHERRY ADVANCED PO) Take 1,200 mg by mouth daily.    . Multiple Vitamin (MULTIVITAMIN) capsule Take 1 capsule by mouth daily.    Marland Kitchen nystatin-triamcinolone (MYCOLOG II) cream Apply topically.    Marland Kitchen OVER THE COUNTER MEDICATION Hylaurionic Acid For arthritis    . Polyvinyl Alcohol-Povidone (REFRESH OP) Apply to eye.    .  Probiotic Product (PROBIOTIC DAILY PO) Take by mouth.    . Riboflavin (VITAMIN B2 PO) Take by mouth.    . Risedronate Sodium 35 MG TBEC Take by mouth.    . tretinoin (RETIN-A) 0.025 % cream Apply topically at bedtime. 135 g 3   No facility-administered medications prior to visit.     ROS See HPI  Objective:  BP 126/80   Pulse 85   Temp 98.9 F (37.2 C) (Oral)   Ht 5' 9.5" (1.765 m)   Wt 219 lb 12.8 oz (99.7 kg)   SpO2 98%   BMI 31.99 kg/m   BP Readings from Last 3 Encounters:  03/26/18 126/80  02/27/18 134/72  12/13/17 136/74    Wt Readings from Last 3 Encounters:  03/26/18 219 lb 12.8 oz (99.7 kg)  02/27/18 220 lb 3.2 oz (99.9 kg)  12/12/17 214 lb (97.1 kg)    Physical Exam Vitals signs reviewed.  HENT:     Head:     Jaw: No trismus.     Right Ear: Tympanic membrane, ear canal and external ear normal.     Left Ear: Tympanic membrane, ear canal and external ear normal.     Nose: Mucosal edema and rhinorrhea present.     Right Sinus: Maxillary sinus tenderness present. No frontal sinus tenderness.     Left Sinus: Maxillary sinus tenderness present. No frontal sinus tenderness.     Mouth/Throat:  Pharynx: Uvula midline. Posterior oropharyngeal erythema present. No oropharyngeal exudate.  Eyes:     General: No scleral icterus. Neck:     Musculoskeletal: Normal range of motion and neck supple.  Cardiovascular:     Rate and Rhythm: Normal rate and regular rhythm.     Heart sounds: Normal heart sounds.  Pulmonary:     Effort: Pulmonary effort is normal.     Breath sounds: Normal breath sounds.  Lymphadenopathy:     Cervical: Cervical adenopathy present.  Neurological:     Mental Status: She is alert and oriented to person, place, and time.     Lab Results  Component Value Date   WBC 5.8 02/27/2018   HGB 13.6 02/27/2018   HCT 40.4 02/27/2018   PLT 302 02/27/2018   GLUCOSE 93 02/27/2018   CHOL 194 02/27/2018   TRIG 144 02/27/2018   HDL 61 02/27/2018     LDLCALC 107 (H) 02/27/2018   ALT 22 02/27/2018   AST 22 02/27/2018   NA 141 02/27/2018   K 4.5 02/27/2018   CL 104 02/27/2018   CREATININE 0.79 02/27/2018   BUN 11 02/27/2018   CO2 25 02/27/2018   TSH 0.99 02/27/2018    Dg Cervical Spine Complete  Result Date: 08/25/2014 CLINICAL DATA:  Neck pain after carrying heavy objects. EXAM: CERVICAL SPINE  4+ VIEWS COMPARISON:  None. FINDINGS: Diffuse degenerative change with prominent endplate osteophyte formation and disc space loss C5-C6 is C6-C7. Diffuse osteopenia. No evidence fracture or dislocation. Loss of normal cervical spine lordosis noted, most likely from degenerative disease. Multilevel mild bilateral neural foraminal narrowing secondary to degenerative change. IMPRESSION: No evidence of fracture or dislocation. 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 Electronically Signed   By: Marcello Moores  Register   On: 08/25/2014 11:31    Assessment & Plan:   Dwana was seen today for nasal congestion.  Diagnoses and all orders for this visit:  Acute nasopharyngitis -     methylPREDNISolone acetate (DEPO-MEDROL) injection 40 mg -     fluticasone (FLONASE) 50 MCG/ACT nasal spray; Place 2 sprays into both nostrils daily. -     oxymetazoline (AFRIN NASAL SPRAY) 0.05 % nasal spray; Place 1 spray into both nostrils 2 (two) times daily. Use only for 3days, then stop -     sodium chloride (OCEAN) 0.65 % SOLN nasal spray; Place 1 spray into both nostrils as needed for congestion. -     dextromethorphan-guaiFENesin (MUCINEX DM) 30-600 MG 12hr tablet; Take 1 tablet by mouth 2 (two) times daily as needed for cough. -     HYDROcodone-homatropine (HYCODAN) 5-1.5 MG/5ML syrup; Take 5 mLs by mouth every 12 (twelve) hours as needed. -     albuterol (PROVENTIL HFA;VENTOLIN HFA) 108 (90 Base) MCG/ACT inhaler; Inhale 1-2 puffs into the lungs every 6 (six) hours as needed.   I am having Denise Haas. Denise Haas  start on fluticasone, oxymetazoline, sodium chloride, dextromethorphan-guaiFENesin, HYDROcodone-homatropine, and albuterol. I am also having her maintain her latanoprost, Calcium + D3, Melatonin, B-12, Probiotic Product (PROBIOTIC DAILY PO), Polyvinyl Alcohol-Povidone (REFRESH OP), Riboflavin (VITAMIN B2 PO), OVER THE COUNTER MEDICATION, multivitamin, Misc Natural Products (TART CHERRY ADVANCED PO), folic acid, Biotin, Vitamin D, SLEEP, nystatin-triamcinolone, Risedronate Sodium, diclofenac sodium, cyclobenzaprine, and tretinoin. We will continue to administer methylPREDNISolone acetate.  Meds ordered this encounter  Medications  . methylPREDNISolone acetate (DEPO-MEDROL) injection 40 mg  . fluticasone (FLONASE) 50 MCG/ACT nasal spray    Sig: Place  2 sprays into both nostrils daily.    Dispense:  16 g    Refill:  0    Order Specific Question:   Supervising Provider    Answer:   MATTHEWS, CODY [[id number]]  . oxymetazoline (AFRIN NASAL SPRAY) 0.05 % nasal spray    Sig: Place 1 spray into both nostrils 2 (two) times daily. Use only for 3days, then stop    Dispense:  30 mL    Refill:  0    Order Specific Question:   Supervising Provider    Answer:   MATTHEWS, CODY [[id number]]  . sodium chloride (OCEAN) 0.65 % SOLN nasal spray    Sig: Place 1 spray into both nostrils as needed for congestion.    Dispense:  15 mL    Refill:  0    Order Specific Question:   Supervising Provider    Answer:   MATTHEWS, CODY [[id number]]  . dextromethorphan-guaiFENesin (MUCINEX DM) 30-600 MG 12hr tablet    Sig: Take 1 tablet by mouth 2 (two) times daily as needed for cough.    Dispense:  14 tablet    Refill:  0    Order Specific Question:   Supervising Provider    Answer:   MATTHEWS, CODY [[id number]]  . HYDROcodone-homatropine (HYCODAN) 5-1.5 MG/5ML syrup    Sig: Take 5 mLs by mouth every 12 (twelve) hours as needed.    Dispense:  60 mL    Refill:  0    Order Specific Question:   Supervising Provider    Answer:   MATTHEWS, CODY  [[id number]]  . albuterol (PROVENTIL HFA;VENTOLIN HFA) 108 (90 Base) MCG/ACT inhaler    Sig: Inhale 1-2 puffs into the lungs every 6 (six) hours as needed.    Dispense:  1 Inhaler    Refill:  0    Order Specific Question:   Supervising Provider    Answer:   MATTHEWS, CODY [[id number]]    Problem List Items Addressed This Visit    None    Visit Diagnoses    Acute nasopharyngitis    -  Primary   Relevant Medications   methylPREDNISolone acetate (DEPO-MEDROL) injection 40 mg   fluticasone (FLONASE) 50 MCG/ACT nasal spray   oxymetazoline (AFRIN NASAL SPRAY) 0.05 % nasal spray   sodium chloride (OCEAN) 0.65 % SOLN nasal spray   dextromethorphan-guaiFENesin (MUCINEX DM) 30-600 MG 12hr tablet   HYDROcodone-homatropine (HYCODAN) 5-1.5 MG/5ML syrup   albuterol (PROVENTIL HFA;VENTOLIN HFA) 108 (90 Base) MCG/ACT inhaler       Follow-up: Return if symptoms worsen or fail to improve.  Wilfred Lacy, NP

## 2018-03-29 ENCOUNTER — Ambulatory Visit (AMBULATORY_SURGERY_CENTER): Payer: Self-pay

## 2018-03-29 VITALS — Ht 69.5 in | Wt 217.2 lb

## 2018-03-29 DIAGNOSIS — R195 Other fecal abnormalities: Secondary | ICD-10-CM

## 2018-03-29 DIAGNOSIS — R197 Diarrhea, unspecified: Secondary | ICD-10-CM

## 2018-03-29 HISTORY — DX: Diarrhea, unspecified: R19.7

## 2018-03-29 MED ORDER — NA SULFATE-K SULFATE-MG SULF 17.5-3.13-1.6 GM/177ML PO SOLN: 1.0000 | Freq: Once | ORAL | 0 refills | Status: AC

## 2018-03-29 NOTE — Progress Notes (Signed)
Per pt, no allergies to soy or egg products.Pt not taking any weight loss meds or using  O2 at home.  Pt refused emmi video. 

## 2018-04-01 ENCOUNTER — Encounter: Payer: Self-pay | Admitting: Gastroenterology

## 2018-04-04 ENCOUNTER — Encounter: Payer: Self-pay | Admitting: Gastroenterology

## 2018-04-04 ENCOUNTER — Ambulatory Visit (AMBULATORY_SURGERY_CENTER): Payer: No Typology Code available for payment source | Admitting: Gastroenterology

## 2018-04-04 VITALS — BP 128/72 | HR 63 | Temp 98.2°F | Resp 10 | Ht 69.0 in | Wt 217.0 lb

## 2018-04-04 DIAGNOSIS — R195 Other fecal abnormalities: Secondary | ICD-10-CM

## 2018-04-04 MED ORDER — SODIUM CHLORIDE 0.9 % IV SOLN: 500.0000 mL | Freq: Once | INTRAVENOUS | Status: DC

## 2018-04-04 NOTE — Op Note (Signed)
[place] Patient Name: Denise Haas Procedure Date: 04/04/2018 2:30 PM MRN: [id number] Endoscopist: Jackquline Denmark , MD Age: 73 Referring MD:  Date of Birth: [birth year] Gender: Female Account #: [id number] Procedure:                Colonoscopy Indications:              Positive Cologuard test Medicines:                Monitored Anesthesia Care Procedure:                Pre-Anesthesia Assessment:                           - Prior to the procedure, a History and Physical                            was performed, and patient medications and                            allergies were reviewed. The patient's tolerance of                            previous anesthesia was also reviewed. The risks                            and benefits of the procedure and the sedation                            options and risks were discussed with the patient.                            All questions were answered, and informed consent                            was obtained. Prior Anticoagulants: The patient has                            taken no previous anticoagulant or antiplatelet                            agents. ASA Grade Assessment: II - A patient with                            mild systemic disease. After reviewing the risks                            and benefits, the patient was deemed in                            satisfactory condition to undergo the procedure.                           After obtaining informed consent, the colonoscope  was passed under direct vision. Throughout the                            procedure, the patient's blood pressure, pulse, and                            oxygen saturations were monitored continuously. The                            Colonoscope was introduced through the anus and                            advanced to the 2 cm into the ileum. The                            colonoscopy was somewhat difficult due to a                       redundant colon. Successful completion of the                            procedure was aided by applying abdominal pressure.                            The patient tolerated the procedure well. The                            quality of the bowel preparation was good. The                            colon was highly redundant. Scope In: 2:37:04 PM Scope Out: 2:57:28 PM Scope Withdrawal Time: 0 hours 11 minutes 20 seconds  Total Procedure Duration: 0 hours 20 minutes 24 seconds  Findings:                 A few rare small-mouthed diverticula were found in                            the sigmoid colon.                           Non-bleeding internal hemorrhoids were found during                            retroflexion. The hemorrhoids were small.                           The exam was otherwise without abnormality on                            direct and retroflexion views.                           The terminal ileum appeared normal. Complications:            No immediate complications. Estimated Blood Loss:  Estimated blood loss: none. Impression:               -Minimal sigmoid diverticulosis                           -Small internal hemorrhoids                           -Otherwise normal colonoscopy to TI. The colon was                            highly redundant. Recommendation:           - Patient has a contact number available for                            emergencies. The signs and symptoms of potential                            delayed complications were discussed with the                            patient. Return to normal activities tomorrow.                            Written discharge instructions were provided to the                            patient.                           - High fiber diet.                           - Continue present medications.                           - Repeat colonoscopy in 10 years for screening                             purposes. Earlier, if there is any change in family                            history or if she starts having any new problems.                           - Return to GI office PRN. Jackquline Denmark, MD 04/04/2018 3:03:06 PM This report has been signed electronically.

## 2018-04-04 NOTE — Progress Notes (Signed)
Pt's states no medical or surgical changes since previsit or office visit. 

## 2018-04-04 NOTE — Progress Notes (Signed)
PT taken to PACU. Monitors in place. VSS. Report given to RN. 

## 2018-04-04 NOTE — Patient Instructions (Signed)
Please read handouts provided. High fiber diet. Continue present medications. Return to GI office as needed.     YOU HAD AN ENDOSCOPIC PROCEDURE TODAY AT [facility]:   Refer to the procedure report that was given to you for any specific questions about what was found during the examination.  If the procedure report does not answer your questions, please call your gastroenterologist to clarify.  If you requested that your care partner not be given the details of your procedure findings, then the procedure report has been included in a sealed envelope for you to review at your convenience later.  YOU SHOULD EXPECT: Some feelings of bloating in the abdomen. Passage of more gas than usual.  Walking can help get rid of the air that was put into your GI tract during the procedure and reduce the bloating. If you had a lower endoscopy (such as a colonoscopy or flexible sigmoidoscopy) you may notice spotting of blood in your stool or on the toilet paper. If you underwent a bowel prep for your procedure, you may not have a normal bowel movement for a few days.  Please Note:  You might notice some irritation and congestion in your nose or some drainage.  This is from the oxygen used during your procedure.  There is no need for concern and it should clear up in a day or so.  SYMPTOMS TO REPORT IMMEDIATELY:   Following lower endoscopy (colonoscopy or flexible sigmoidoscopy):  Excessive amounts of blood in the stool  Significant tenderness or worsening of abdominal pains  Swelling of the abdomen that is new, acute  Fever of 100F or higher   For urgent or emergent issues, a gastroenterologist can be reached at any hour by calling [phone].   DIET:  We do recommend a small meal at first, but then you may proceed to your regular diet.  Drink plenty of fluids but you should avoid alcoholic beverages for 24 hours.  ACTIVITY:  You should plan to take it easy for the rest of today  and you should NOT DRIVE or use heavy machinery until tomorrow (because of the sedation medicines used during the test).    FOLLOW UP: Our staff will call the number listed on your records the next business day following your procedure to check on you and address any questions or concerns that you may have regarding the information given to you following your procedure. If we do not reach you, we will leave a message.  However, if you are feeling well and you are not experiencing any problems, there is no need to return our call.  We will assume that you have returned to your regular daily activities without incident.  If any biopsies were taken you will be contacted by phone or by letter within the next 1-3 weeks.  Please call us at [phone] if you have not heard about the biopsies in 3 weeks.    SIGNATURES/CONFIDENTIALITY: You and/or your care partner have signed paperwork which will be entered into your electronic medical record.  These signatures attest to the fact that that the information above on your After Visit Summary has been reviewed and is understood.  Full responsibility of the confidentiality of this discharge information lies with you and/or your care-partner.

## 2018-04-05 ENCOUNTER — Telehealth: Payer: Self-pay

## 2018-04-05 NOTE — Telephone Encounter (Signed)
Left message

## 2018-04-05 NOTE — Telephone Encounter (Signed)
  Follow up Call-  Call back number 04/04/2018  Post procedure Call Back phone  # [phone]  Permission to leave phone message Yes  Some recent data might be hidden     Patient questions:  Do you have a fever, pain , or abdominal swelling? No. Pain Score  0 *  Have you tolerated food without any problems? Yes.    Have you been able to return to your normal activities? Yes.    Do you have any questions about your discharge instructions: Diet   No. Medications  No. Follow up visit  No.  Do you have questions or concerns about your Care? No.  Actions: * If pain score is 4 or above: No action needed, pain <4.

## 2018-04-10 ENCOUNTER — Telehealth: Payer: Self-pay | Admitting: Family Medicine

## 2018-04-10 NOTE — Telephone Encounter (Signed)
Copied from [place] [id number]. Topic: General - Inquiry >> Apr 10, 2018 11:16 AM Denise Haas wrote: Reason for CRM: pt called stating she is traveling in [place] - she is still having congestion and coughing - she states that she isn't able to rest, she has no fever, she cannot stop coughing - clear mucus - pt wanted something called in - she is at a local pharmacy she had to walk to and [place]" - she is hoping for expedited order to be placed for her.  [facility], [place], [place] - [address]. [phone] (Phone) [phone] (Fax)

## 2018-04-11 NOTE — Telephone Encounter (Signed)
I LMOVM for pt stating that Dr. Deborra Medina is not in the office/I suggested (no Dx of HTN) to take Mucinex-Cortlin Marano during the day with lots of water and cough drops and NyQuil for night time/thx dmf

## 2018-04-18 ENCOUNTER — Ambulatory Visit (INDEPENDENT_AMBULATORY_CARE_PROVIDER_SITE_OTHER): Payer: No Typology Code available for payment source

## 2018-04-18 ENCOUNTER — Ambulatory Visit: Payer: No Typology Code available for payment source | Admitting: Nurse Practitioner

## 2018-04-18 ENCOUNTER — Encounter: Payer: Self-pay | Admitting: Nurse Practitioner

## 2018-04-18 VITALS — BP 144/78 | HR 90 | Temp 98.8°F | Ht 69.0 in | Wt 218.0 lb

## 2018-04-18 DIAGNOSIS — R05 Cough: Secondary | ICD-10-CM

## 2018-04-18 DIAGNOSIS — R197 Diarrhea, unspecified: Secondary | ICD-10-CM

## 2018-04-18 DIAGNOSIS — K219 Gastro-esophageal reflux disease without esophagitis: Secondary | ICD-10-CM

## 2018-04-18 DIAGNOSIS — R059 Cough, unspecified: Secondary | ICD-10-CM

## 2018-04-18 MED ORDER — OMEPRAZOLE 20 MG PO CPDR: 20.0000 mg | DELAYED_RELEASE_CAPSULE | Freq: Every day | ORAL | 0 refills | Status: DC

## 2018-04-18 MED ORDER — LORATADINE 10 MG PO TABS: 10.0000 mg | ORAL_TABLET | Freq: Every day | ORAL | 0 refills | Status: DC

## 2018-04-18 MED ORDER — BENZONATATE 100 MG PO CAPS: 100.0000 mg | ORAL_CAPSULE | Freq: Three times a day (TID) | ORAL | 0 refills | Status: DC | PRN

## 2018-04-18 NOTE — Progress Notes (Addendum)
Subjective:  Patient ID: Denise Haas, female    DOB: 1946/03/05  Age: 73 y.o. MRN: [id number]  CC: Cough (pt is c/o of coughing yellow mucus,ocngestions/ got better from last ov but came back 4 days ago/ otc cough med,flonase. request z pak?) and Diarrhea (pt is c/o of diarrhea,going on 2 mo/ took otc gas x,pepto bismo--colonoscopy was normal. )   cough  Cough  This is a new problem. The current episode started 1 to 4 weeks ago. The problem has been waxing and waning. The cough is non-productive. Associated symptoms include nasal congestion and postnasal drip. Pertinent negatives include no chest pain, chills, fever, headaches, myalgias, rash, rhinorrhea, sore throat, shortness of breath, sweats, weight loss or wheezing. The symptoms are aggravated by cold air and lying down. She has tried OTC cough suppressant for the symptoms. There is no history of bronchitis or environmental allergies.  Diarrhea   This is a new problem. The current episode started more than 1 month ago. The problem occurs 2 to 4 times per day. The problem has been waxing and waning. Diarrhea characteristics: soft stool with urgency. The patient states that diarrhea does not awaken her from sleep. Associated symptoms include bloating, coughing and increased flatus. Pertinent negatives include no abdominal pain, arthralgias, chills, fever, headaches, myalgias, sweats, URI, vomiting or weight loss. There are no known risk factors. She has tried bismuth subsalicylate for the symptoms. The treatment provided moderate relief.   Albuterol caused bronchospasm. developped rash with hycodan cough syrup Stopped flonase due to glaucoma. Hx of GERD, use of TUMs prn.  Diarrheax58months. Colonoscopy done 03/2018: normal. Stool describes as explosive soft stool, no liquid, floating, no blood, no melena. Has about 3-4 small stool per day. Rectal urgency in morning. Consumes 4-5cups of coffee per day.  Reviewed past Medical, Social and  Family history today.  Outpatient Medications Prior to Visit  Medication Sig Dispense Refill  . Biotin 300 MCG TABS Take by mouth.    . Calcium Carb-Cholecalciferol (CALCIUM + D3) 600-200 MG-UNIT TABS Take 1 tablet by mouth 2 (two) times daily.    . Cholecalciferol (VITAMIN D) 2000 units tablet Take by mouth.    . Cyanocobalamin (B-12) 2500 MCG TABS Take 1 tablet by mouth daily.    . folic acid (FOLVITE) 537 MCG tablet Take 800 mcg by mouth daily.    Marland Kitchen latanoprost (XALATAN) 0.005 % ophthalmic solution Place 1 drop into both eyes.     . Melaton-Thean-Cham-PassF-LBalm (SLEEP) CAPS Take by mouth.    . Melatonin 2.5 MG CAPS Take 1 capsule by mouth Nightly.    . Misc Natural Products (TART CHERRY ADVANCED PO) Take 1,200 mg by mouth daily.    . Multiple Vitamin (MULTIVITAMIN) capsule Take 1 capsule by mouth daily.    Marland Kitchen nystatin-triamcinolone (MYCOLOG II) cream Apply topically.    Marland Kitchen OVER THE COUNTER MEDICATION Hylaurionic Acid-Take 2 capsules a day For arthritis    . Polyvinyl Alcohol-Povidone (REFRESH OP) Apply to eye. Apply one drop each eye prn dry eyes    . Probiotic Product (PROBIOTIC DAILY PO) Take by mouth.    . Riboflavin (VITAMIN B2 PO) Take by mouth.    . Risedronate Sodium 35 MG TBEC Take by mouth.    . tretinoin (RETIN-A) 0.025 % cream Apply topically at bedtime. 135 g 3  . albuterol (PROVENTIL HFA;VENTOLIN HFA) 108 (90 Base) MCG/ACT inhaler Inhale 1-2 puffs into the lungs every 6 (six) hours as needed. (Patient not taking: Reported on 03/29/2018)  1 Inhaler 0  . brompheniramine-pseudoephedrine-DM 30-2-10 MG/5ML syrup Take by mouth 4 (four) times daily as needed.    . cyclobenzaprine (FLEXERIL) 10 MG tablet Take 1 tablet (10 mg total) by mouth at bedtime. (Patient not taking: Reported on 03/29/2018) 30 tablet 0  . dextromethorphan-guaiFENesin (MUCINEX DM) 30-600 MG 12hr tablet Take 1 tablet by mouth 2 (two) times daily as needed for cough. 14 tablet 0  . diclofenac sodium (VOLTAREN) 1 %  GEL Apply 2 g topically 4 (four) times daily. (Patient not taking: Reported on 03/29/2018) 100 g 0  . fluticasone (FLONASE) 50 MCG/ACT nasal spray Place 2 sprays into both nostrils daily. (Patient not taking: Reported on 03/29/2018) 16 g 0  . guaiFENesin-dextromethorphan (ROBITUSSIN DM) 100-10 MG/5ML syrup Take 5 mLs by mouth as needed for cough.    Marland Kitchen HYDROcodone-homatropine (HYCODAN) 5-1.5 MG/5ML syrup Take 5 mLs by mouth every 12 (twelve) hours as needed. (Patient not taking: Reported on 03/29/2018) 60 mL 0  . oxymetazoline (AFRIN NASAL SPRAY) 0.05 % nasal spray Place 1 spray into both nostrils 2 (two) times daily. Use only for 3days, then stop 30 mL 0  . sodium chloride (OCEAN) 0.65 % SOLN nasal spray Place 1 spray into both nostrils as needed for congestion. 15 mL 0   No facility-administered medications prior to visit.     ROS See HPI  Objective:  BP (!) 144/78   Pulse 90   Temp 98.8 F (37.1 C) (Oral)   Ht 5\' 9"  (1.753 m)   Wt 218 lb (98.9 kg)   SpO2 95%   BMI 32.19 kg/m   BP Readings from Last 3 Encounters:  04/18/18 (!) 144/78  04/04/18 128/72  03/26/18 126/80    Wt Readings from Last 3 Encounters:  04/18/18 218 lb (98.9 kg)  04/04/18 217 lb (98.4 kg)  03/29/18 217 lb 3.2 oz (98.5 kg)    Physical Exam HENT:     Right Ear: Tympanic membrane, ear canal and external ear normal.     Left Ear: Tympanic membrane, ear canal and external ear normal.     Nose: Congestion present. No rhinorrhea.     Mouth/Throat:     Pharynx: Posterior oropharyngeal erythema present.  Cardiovascular:     Rate and Rhythm: Normal rate and regular rhythm.  Pulmonary:     Effort: Pulmonary effort is normal.     Breath sounds: Normal breath sounds.  Abdominal:     Palpations: Abdomen is soft.     Tenderness: There is no abdominal tenderness.  Musculoskeletal:     Right lower leg: No edema.     Left lower leg: No edema.  Skin:    Findings: No rash.  Neurological:     Mental Status: She  is alert and oriented to person, place, and time.     Lab Results  Component Value Date   WBC 5.8 02/27/2018   HGB 13.6 02/27/2018   HCT 40.4 02/27/2018   PLT 302 02/27/2018   GLUCOSE 93 02/27/2018   CHOL 194 02/27/2018   TRIG 144 02/27/2018   HDL 61 02/27/2018   LDLCALC 107 (H) 02/27/2018   ALT 22 02/27/2018   AST 22 02/27/2018   NA 141 02/27/2018   K 4.5 02/27/2018   CL 104 02/27/2018   CREATININE 0.79 02/27/2018   BUN 11 02/27/2018   CO2 25 02/27/2018   TSH 0.99 02/27/2018    Dg Cervical Spine Complete  Result Date: 08/25/2014 CLINICAL DATA:  Neck pain after carrying heavy  objects. EXAM: CERVICAL SPINE  4+ VIEWS COMPARISON:  None. FINDINGS: Diffuse degenerative change with prominent endplate osteophyte formation and disc space loss C5-C6 is C6-C7. Diffuse osteopenia. No evidence fracture or dislocation. Loss of normal cervical spine lordosis noted, most likely from degenerative disease. Multilevel mild bilateral neural foraminal narrowing secondary to degenerative change. IMPRESSION: No evidence of fracture or dislocation. 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 Electronically Signed   By: Marcello Moores  Register   On: 08/25/2014 11:31    Assessment & Plan:   Adriena was seen today for cough and diarrhea.  Diagnoses and all orders for this visit:  Cough -     DG Chest 2 View; Future -     benzonatate (TESSALON) 100 MG capsule; Take 1 capsule (100 mg total) by mouth 3 (three) times daily as needed for cough. -     DG Chest 2 View -     omeprazole (PRILOSEC) 20 MG capsule; Take 1 capsule (20 mg total) by mouth daily. -     loratadine (CLARITIN) 10 MG tablet; Take 1 tablet (10 mg total) by mouth daily. -     Ambulatory referral to Gastroenterology  Gastroesophageal reflux disease, esophagitis presence not specified -     omeprazole (PRILOSEC) 20 MG capsule; Take 1 capsule (20 mg total) by mouth daily. -     Ambulatory  referral to Gastroenterology  Diarrhea, unspecified type -     Helicobacter pylori special antigen; Future -     Ambulatory referral to Gastroenterology   I have discontinued [name]. Rusnak's diclofenac sodium, cyclobenzaprine, fluticasone, oxymetazoline, sodium chloride, dextromethorphan-guaiFENesin, HYDROcodone-homatropine, albuterol, brompheniramine-pseudoephedrine-DM, and guaiFENesin-dextromethorphan. I am also having her start on benzonatate, omeprazole, and loratadine. Additionally, I am having her maintain her latanoprost, Calcium + D3, Melatonin, B-12, Probiotic Product (PROBIOTIC DAILY PO), Polyvinyl Alcohol-Povidone (REFRESH OP), Riboflavin (VITAMIN B2 PO), OVER THE COUNTER MEDICATION, multivitamin, Misc Natural Products (TART CHERRY ADVANCED PO), folic acid, Biotin, Vitamin D, SLEEP, nystatin-triamcinolone, Risedronate Sodium, and tretinoin.  Meds ordered this encounter  Medications  . benzonatate (TESSALON) 100 MG capsule    Sig: Take 1 capsule (100 mg total) by mouth 3 (three) times daily as needed for cough.    Dispense:  20 capsule    Refill:  0    Order Specific Question:   Supervising Provider    Answer:   Lucille Passy [[id number]]  . omeprazole (PRILOSEC) 20 MG capsule    Sig: Take 1 capsule (20 mg total) by mouth daily.    Dispense:  14 capsule    Refill:  0    Order Specific Question:   Supervising Provider    Answer:   Lucille Passy [[id number]]  . loratadine (CLARITIN) 10 MG tablet    Sig: Take 1 tablet (10 mg total) by mouth daily.    Dispense:  14 tablet    Refill:  0    Order Specific Question:   Supervising Provider    Answer:   Lucille Passy [[id number]]    Problem List Items Addressed This Visit    None    Visit Diagnoses    Cough    -  Primary   Relevant Medications   benzonatate (TESSALON) 100 MG capsule   omeprazole (PRILOSEC) 20 MG capsule   loratadine (CLARITIN) 10 MG tablet   Other Relevant Orders   DG Chest 2 View (Completed)   Ambulatory referral to  Gastroenterology   Gastroesophageal reflux disease, esophagitis presence  not specified       Relevant Medications   omeprazole (PRILOSEC) 20 MG capsule   Other Relevant Orders   Ambulatory referral to Gastroenterology   Diarrhea, unspecified type       Relevant Orders   Helicobacter pylori special antigen (Completed)   Ambulatory referral to Gastroenterology       Follow-up: Return if symptoms worsen or fail to improve.  Wilfred Lacy, NP

## 2018-04-18 NOTE — Patient Instructions (Addendum)
Normal CXR. No need for oral abx at this time. Use omeprazole and claritin as discussed. Omeprazole rx sent. claritin can be found OTC. If no improvement in 2weeks discuss need for ENT referral with Dr. Deborra Medina.  Stool will check for H.Pylori through [facility].  Use benzonatae for cough. If CXR is negative, I will recommend use of omeprazole 20mg  and claritin x 2weeks.  Decrease caffeine intake to 2cups per day. Stop peptobismol and gax X. Maintain adequate oral hydration with water mostly.

## 2018-04-22 ENCOUNTER — Other Ambulatory Visit: Payer: No Typology Code available for payment source

## 2018-04-22 DIAGNOSIS — R197 Diarrhea, unspecified: Secondary | ICD-10-CM

## 2018-04-23 LAB — HELICOBACTER PYLORI  SPECIAL ANTIGEN
MICRO NUMBER: 173270
SPECIMEN QUALITY: ADEQUATE

## 2018-04-24 NOTE — Addendum Note (Signed)
Addended by: Wilfred Lacy L on: 04/24/2018 11:56 AM   Modules accepted: Orders

## 2018-05-16 ENCOUNTER — Other Ambulatory Visit (INDEPENDENT_AMBULATORY_CARE_PROVIDER_SITE_OTHER): Payer: No Typology Code available for payment source

## 2018-05-16 ENCOUNTER — Ambulatory Visit: Payer: No Typology Code available for payment source | Admitting: Gastroenterology

## 2018-05-16 ENCOUNTER — Encounter: Payer: Self-pay | Admitting: Gastroenterology

## 2018-05-16 VITALS — BP 122/74 | HR 90 | Ht 69.5 in | Wt 219.5 lb

## 2018-05-16 DIAGNOSIS — R197 Diarrhea, unspecified: Secondary | ICD-10-CM | POA: Diagnosis not present

## 2018-05-16 NOTE — Patient Instructions (Addendum)
If you are age 72 or older, your body mass index should be between 23-30. Your Body mass index is 31.95 kg/m. If this is out of the aforementioned range listed, please consider follow up with your Primary Care Provider.  If you are age 64 or younger, your body mass index should be between 19-25. Your Body mass index is 31.95 kg/m. If this is out of the aformentioned range listed, please consider follow up with your Primary Care Provider.   Please purchase the following medications over the counter and take as directed: Imodium AD 2mg  as needed before going out.  Thank you,  Dr. Jackquline Denmark

## 2018-05-16 NOTE — Progress Notes (Signed)
Chief Complaint:   Referring Provider:  Flossie Buffy, NP      ASSESSMENT AND PLAN;   #1. Diarrhea. Nl TSH, CBC, CMP 02/2018  #2. Positive cologuard with neg colon 04/04/2018  Plan: - Check celiac screen. - GI pathogen, giardia pathogen, fat, fecal elastase and WBC - Imodium AD prn for now especially when she goes out. - FU in 6 weeks. - If still with problems, and the above work-up is negative, will give her a trial of metronidazole possibly followed by Lomotil.   HPI:    Denise Haas is a 73 y.o. female  Had colonoscopy (as a direct visit due to positive Cologuard) on 04/04/2018.  Comes for follow-up visit Having loose/softer BMs 4-5 times per day ever since [place] cruise October 2019.  The stool does float.  Prior to the cruise she was having 1 bowel movement per day.  Has some abdominal discomfort and bloating.  Denies having any melena or hematochezia.  Denies having any weight loss or loss of appetite.  No fever chills or night sweats.  This is definitely a change in bowel habits.  We were not aware of that at the time of colonoscopy and hence did not do random colonic biopsies.  Denies having any nausea, vomiting, heartburn, regurgitation or odynophagia.     Past Medical History:  Diagnosis Date  . Abnormal liver function test 02/20/2015  . Acne   . Acute bronchitis 02/20/2015  . Allergy    multiple drug allergies  . Anxiety   . Arthritis   . Cataract   . Diarrhea 03/29/2018   with gas for over a month  . GERD (gastroesophageal reflux disease)    no meds  . Glaucoma   . H/O measles   . H/O mumps   . History of chicken pox   . History of colonic polyps   . History of colonic polyps 02/21/2016  . Neuromuscular disorder ([place])   . Osteopenia   . Seborrhea capitis 08/28/2016    Past Surgical History:  Procedure Laterality Date  . [place]   cage placed in lumbar  . BREAST SURGERY  1980's   benign left breast biopsy for cyst  .  EYE SURGERY     cataracts b/l    Family History  Problem Relation Age of Onset  . Cancer Mother        breast  . Heart disease Mother   . Diabetes Mother   . Asthma Mother   . Hyperlipidemia Mother   . Peripheral vascular disease Mother   . COPD Father   . Heart disease Father   . Arthritis Father   . Stroke Maternal Grandmother   . Heart disease Maternal Grandmother   . Heart disease Maternal Grandfather   . Heart attack Maternal Grandfather   . Cancer Paternal Grandfather        throat  . Glaucoma Sister   . Glaucoma Brother   . Heart disease Brother   . Alcohol abuse Brother   . Depression Brother   . Hyperlipidemia Brother   . COPD Daughter   . Glaucoma Brother   . Cancer Brother        skin cancer, lost tip of thumb  . Glaucoma Brother   . Crohn's disease Brother        colostomy in place  . Cancer Cousin        colon   . Colon cancer Cousin  Social History   Tobacco Use  . Smoking status: Never Smoker  . Smokeless tobacco: Never Used  Substance Use Topics  . Alcohol use: Yes    Alcohol/week: 14.0 standard drinks    Types: 14 Glasses of wine per week  . Drug use: No    Current Outpatient Medications  Medication Sig Dispense Refill  . Biotin 300 MCG TABS Take by mouth.    . Calcium Carb-Cholecalciferol (CALCIUM + D3) 600-200 MG-UNIT TABS Take 1 tablet by mouth 2 (two) times daily.    . Cholecalciferol (VITAMIN D) 2000 units tablet Take by mouth.    . Cyanocobalamin (B-12) 2500 MCG TABS Take 1 tablet by mouth daily.    . folic acid (FOLVITE) 992 MCG tablet Take 800 mcg by mouth daily.    Marland Kitchen latanoprost (XALATAN) 0.005 % ophthalmic solution Place 1 drop into both eyes.     Marland Kitchen loratadine (CLARITIN) 10 MG tablet Take 1 tablet (10 mg total) by mouth daily. 14 tablet 0  . Melatonin 2.5 MG CAPS Take 1 capsule by mouth Nightly.    . Misc Natural Products (TART CHERRY ADVANCED PO) Take 1,200 mg by mouth daily.    . Multiple Vitamin (MULTIVITAMIN) capsule  Take 1 capsule by mouth daily.    Marland Kitchen omeprazole (PRILOSEC) 20 MG capsule Take 1 capsule (20 mg total) by mouth daily. 14 capsule 0  . OVER THE COUNTER MEDICATION Hylaurionic Acid-Take 2 capsules a day For arthritis    . Polyvinyl Alcohol-Povidone (REFRESH OP) Apply to eye. Apply one drop each eye prn dry eyes    . Riboflavin (VITAMIN B2 PO) Take by mouth.    . Risedronate Sodium 35 MG TBEC Take by mouth.    . tretinoin (RETIN-A) 0.025 % cream Apply topically at bedtime. 135 g 3   No current facility-administered medications for this visit.     Allergies  Allergen Reactions  . Amoxicillin Hives  . Aspirin Hives  . Erythromycin Hives and Rash  . Ibuprofen Hives  . Latex Rash  . Tetracyclines & Related Hives  . Triple Antibiotic [Bacitracin-Neomycin-Polymyxin] Rash  . Codeine     Codeine cough syrup cause a rash and hives  . Flonase [Fluticasone]     Flonase causes hives,rash  . Gabapentin Swelling    ankles swelling.     Review of Systems:  Constitutional: Denies fever, chills, diaphoresis, appetite change and fatigue.  HEENT: Denies photophobia, eye pain, redness, hearing loss, ear pain, congestion, sore throat, rhinorrhea, sneezing, mouth sores, neck pain, neck stiffness and tinnitus.   Respiratory: Denies SOB, DOE, cough, chest tightness,  and wheezing.   Cardiovascular: Denies chest pain, palpitations and leg swelling.  Genitourinary: Denies dysuria, urgency, frequency, hematuria, flank pain and difficulty urinating.  Musculoskeletal: Denies myalgias, back pain, joint swelling, arthralgias and gait problem.  Skin: No rash.  Neurological: Denies dizziness, seizures, syncope, weakness, light-headedness, numbness and headaches.  Hematological: Denies adenopathy. Easy bruising, personal or family bleeding history  Psychiatric/Behavioral: No anxiety or depression     Physical Exam:    BP 122/74   Pulse 90   Ht 5' 9.5" (1.765 m)   Wt 219 lb 8 oz (99.6 kg)   BMI 31.95 kg/m   Filed Weights   05/16/18 1055  Weight: 219 lb 8 oz (99.6 kg)   Constitutional:  Well-developed, in no acute distress. Psychiatric: Normal mood and affect. Behavior is normal. HEENT: Pupils normal.  Conjunctivae are normal. No scleral icterus. Neck supple.  Cardiovascular: Normal rate, regular  rhythm. No edema Pulmonary/chest: Effort normal and breath sounds normal. No wheezing, rales or rhonchi. Abdominal: Soft, nondistended. Nontender. Bowel sounds active throughout. There are no masses palpable. No hepatomegaly. Rectal:  defered Neurological: Alert and oriented to person place and time. Skin: Skin is warm and dry. No rashes noted.  Data Reviewed: I have personally reviewed following labs and imaging studies  CBC: CBC Latest Ref Rng & Units 02/27/2018 02/22/2017 02/22/2016  WBC 3.8 - 10.8 Thousand/uL 5.8 5.9 5.5  Hemoglobin 11.7 - 15.5 g/dL 13.6 13.3 14.3  Hematocrit 35.0 - 45.0 % 40.4 38.9 44  Platelets 140 - 400 Thousand/uL 302 268 294    CMP: CMP Latest Ref Rng & Units 02/27/2018 02/22/2017 02/22/2016  Glucose 65 - 99 mg/dL 93 106(H) -  BUN 7 - 25 mg/dL 11 14 13   Creatinine 0.60 - 0.93 mg/dL 0.79 0.74 0.8  Sodium 135 - 146 mmol/L 141 139 139  Potassium 3.5 - 5.3 mmol/L 4.5 4.6 4.7  Chloride 98 - 110 mmol/L 104 105 -  CO2 20 - 32 mmol/L 25 23 -  Calcium 8.6 - 10.4 mg/dL 9.7 10.1 -  Total Protein 6.1 - 8.1 g/dL 7.0 6.8 -  Total Bilirubin 0.2 - 1.2 mg/dL 0.7 0.6 -  Alkaline Phos 25 - 125 U/L - - 52  AST 10 - 35 U/L 22 20 26   ALT 6 - 29 U/L 22 22 27      Radiology Studies: Dg Chest 2 View  Result Date: 04/18/2018 CLINICAL DATA:  Productive cough X 3 weeks yellow phlegm. Hx of lumpectomy on left breast. EXAM: CHEST - 2 VIEW COMPARISON:  05/17/2009 FINDINGS: The heart size and mediastinal contours are within normal limits. Both lungs are clear. No pleural effusion or pneumothorax. The visualized skeletal structures are unremarkable. IMPRESSION: No active cardiopulmonary  disease. Electronically Signed   By: Lajean Manes M.D.   On: 04/18/2018 10:36      Carmell Austria, MD 05/16/2018, 11:09 AM  Cc: Flossie Buffy, NP

## 2018-05-17 ENCOUNTER — Other Ambulatory Visit: Payer: No Typology Code available for payment source

## 2018-05-17 DIAGNOSIS — R197 Diarrhea, unspecified: Secondary | ICD-10-CM

## 2018-05-20 LAB — GIARDIA ANTIGEN
MICRO NUMBER:: 286932
RESULT:: NOT DETECTED
SPECIMEN QUALITY:: ADEQUATE

## 2018-05-20 LAB — CELIAC DISEASE PANEL
(TTG) AB, IGA: 1 U/mL
(tTG) Ab, IgG: 6 U/mL — ABNORMAL HIGH
Gliadin IgA: 4 Units
Gliadin IgG: 2 Units
Immunoglobulin A: 247 mg/dL (ref 70–320)

## 2018-05-24 LAB — FECAL FAT, QUALITATIVE: FECAL FAT, QUALITATIVE: NORMAL

## 2018-05-24 LAB — FECAL LACTOFERRIN, QUANT
Fecal Lactoferrin: NEGATIVE
MICRO NUMBER:: 287221
SPECIMEN QUALITY:: ADEQUATE

## 2018-05-27 LAB — GASTROINTESTINAL PATHOGEN PANEL PCR
C. difficile Tox A/B, PCR: NOT DETECTED
Campylobacter, PCR: NOT DETECTED
Cryptosporidium, PCR: NOT DETECTED
E coli (ETEC) LT/ST PCR: NOT DETECTED
E coli (STEC) stx1/stx2, PCR: NOT DETECTED
E coli 0157, PCR: NOT DETECTED
GIARDIA LAMBLIA, PCR: NOT DETECTED
Norovirus, PCR: NOT DETECTED
Rotavirus A, PCR: NOT DETECTED
Salmonella, PCR: NOT DETECTED
Shigella, PCR: NOT DETECTED

## 2018-05-27 LAB — PANCREATIC ELASTASE, FECAL: Pancreatic Elastase-1, Stool: 254 mcg/g

## 2018-06-10 ENCOUNTER — Encounter: Payer: No Typology Code available for payment source | Admitting: Gastroenterology

## 2018-08-27 ENCOUNTER — Encounter: Payer: Self-pay | Admitting: Gastroenterology

## 2018-08-27 ENCOUNTER — Other Ambulatory Visit: Payer: Self-pay

## 2018-08-27 ENCOUNTER — Telehealth (INDEPENDENT_AMBULATORY_CARE_PROVIDER_SITE_OTHER): Payer: No Typology Code available for payment source | Admitting: Gastroenterology

## 2018-08-27 VITALS — Ht 69.0 in | Wt 219.0 lb

## 2018-08-27 DIAGNOSIS — R768 Other specified abnormal immunological findings in serum: Secondary | ICD-10-CM

## 2018-08-27 DIAGNOSIS — R197 Diarrhea, unspecified: Secondary | ICD-10-CM | POA: Diagnosis not present

## 2018-08-27 DIAGNOSIS — K219 Gastro-esophageal reflux disease without esophagitis: Secondary | ICD-10-CM

## 2018-08-27 NOTE — Patient Instructions (Signed)
If you are age 72 or older, your body mass index should be between 23-30. Your Body mass index is 32.34 kg/m. If this is out of the aforementioned range listed, please consider follow up with your Primary Care Provider.  If you are age 55 or younger, your body mass index should be between 19-25. Your Body mass index is 32.34 kg/m. If this is out of the aformentioned range listed, please consider follow up with your Primary Care Provider.   You have been scheduled for an endoscopy. Please follow written instructions given to you at your visit today. If you use inhalers (even only as needed), please bring them with you on the day of your procedure. Your physician has requested that you go to www.startemmi.com and enter the access code given to you at your visit today. This web site gives a general overview about your procedure. However, you should still follow specific instructions given to you by our office regarding your preparation for the procedure.  To help prevent the possible spread of infection to our patients, communities, and staff; we will be implementing the following measures:  As of now we are not allowing any visitors/family members to accompany you to any upcoming appointments with [facility]. If you have any concerns about this please contact our office to discuss prior to the appointment.   Thank you,  Dr. Jackquline Denmark

## 2018-08-27 NOTE — Progress Notes (Signed)
Chief Complaint:   Referring Provider:  Lucille Passy, MD      ASSESSMENT AND PLAN;   #1. Diarrhea. Nl TSH, CBC, CMP 02/2018. 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 Neg stool studies.  #2. Positive cologuard with neg colon 04/04/2018  Plan: - EGD with SB Bx - Imodium AD prn for now especially when she goes out. - FU in 6 weeks. - If still with problems, and the above work-up is negative, will give her a trial of metronidazole possibly followed by Lomotil.   HPI:    Denise Haas is a 73 y.o. female  Had colonoscopy (as a direct visit due to positive Cologuard) on 04/04/2018. For FU Doing somewhat better Having diarrhea only in the morning and then she is fine thereafter.  Compliant with gluten-free diet.  Having loose/softer BMs 4-5 times per day ever since [place] cruise October 2019.  The stool does float.  Prior to the cruise she was having 1 bowel movement per day.  Has some abdominal discomfort and bloating.  Denies having any melena or hematochezia.  Denies having any weight loss or loss of appetite.  No fever chills or night sweats.  This is definitely a change in bowel habits.  We were not aware of that at the time of colonoscopy and hence did not do random colonic biopsies.  Denies having any nausea, vomiting, heartburn, regurgitation or odynophagia.     Past Medical History:  Diagnosis Date  . Abnormal liver function test 02/20/2015  . Acne   . Acute bronchitis 02/20/2015  . Allergy    multiple drug allergies  . Anxiety   . Arthritis   . Cataract   . Diarrhea 03/29/2018   with gas for over a month  . GERD (gastroesophageal reflux disease)    no meds  . Glaucoma   . H/O measles   . H/O mumps   . History of chicken pox   . History of colonic polyps   . History of colonic polyps 02/21/2016  . Neuromuscular disorder ([place])   . Osteopenia   . Seborrhea capitis 08/28/2016    Past Surgical History:  Procedure Laterality Date  . [place]   cage placed in lumbar  . BREAST SURGERY  1980's   benign left breast biopsy for cyst  . EYE SURGERY     cataracts b/l    Family History  Problem Relation Age of Onset  . Cancer Mother        breast  . Heart disease Mother   . Diabetes Mother   . Asthma Mother   . Hyperlipidemia Mother   . Peripheral vascular disease Mother   . COPD Father   . Heart disease Father   . Arthritis Father   . Stroke Maternal Grandmother   . Heart disease Maternal Grandmother   . Heart disease Maternal Grandfather   . Heart attack Maternal Grandfather   . Cancer Paternal Grandfather        throat  . Glaucoma Sister   . Glaucoma Brother   . Heart disease Brother   . Alcohol abuse Brother   . Depression Brother   . Hyperlipidemia Brother   . COPD Daughter   . Glaucoma Brother   . Cancer Brother        skin cancer, lost tip of thumb  . Glaucoma Brother   . Crohn's disease Brother        colostomy in place  .  Cancer Cousin        colon   . Colon cancer Cousin     Social History   Tobacco Use  . Smoking status: Never Smoker  . Smokeless tobacco: Never Used  Substance Use Topics  . Alcohol use: Yes    Alcohol/week: 14.0 standard drinks    Types: 14 Glasses of wine per week  . Drug use: No    Current Outpatient Medications  Medication Sig Dispense Refill  . Biotin 300 MCG TABS Take by mouth.    . Calcium Carb-Cholecalciferol (CALCIUM + D3) 600-200 MG-UNIT TABS Take 1 tablet by mouth 2 (two) times daily.    . Cholecalciferol (VITAMIN D) 2000 units tablet Take by mouth.    . Cyanocobalamin (B-12) 2500 MCG TABS Take 1 tablet by mouth daily.    . folic acid (FOLVITE) 500 MCG tablet Take 800 mcg by mouth daily.    Marland Kitchen latanoprost (XALATAN) 0.005 % ophthalmic solution Place 1 drop into both eyes.     Marland Kitchen loratadine (CLARITIN) 10 MG tablet Take 1 tablet (10 mg total) by mouth daily. 14 tablet 0  . Melatonin 2.5 MG CAPS Take 1 capsule by mouth Nightly.    . Misc Natural  Products (TART CHERRY ADVANCED PO) Take 1,200 mg by mouth daily.    . Multiple Vitamin (MULTIVITAMIN) capsule Take 1 capsule by mouth daily.    Marland Kitchen omeprazole (PRILOSEC) 20 MG capsule Take 1 capsule (20 mg total) by mouth daily. 14 capsule 0  . OVER THE COUNTER MEDICATION Hylaurionic Acid-Take 2 capsules a day For arthritis    . Polyvinyl Alcohol-Povidone (REFRESH OP) Apply to eye. Apply one drop each eye prn dry eyes    . Riboflavin (VITAMIN B2 PO) Take by mouth.    . Risedronate Sodium 35 MG TBEC Take by mouth.    . tretinoin (RETIN-A) 0.025 % cream Apply topically at bedtime. 135 g 3   No current facility-administered medications for this visit.     Allergies  Allergen Reactions  . Amoxicillin Hives  . Aspirin Hives  . Erythromycin Hives and Rash  . Ibuprofen Hives  . Latex Rash  . Tetracyclines & Related Hives  . Triple Antibiotic [Bacitracin-Neomycin-Polymyxin] Rash  . Codeine     Codeine cough syrup cause a rash and hives  . Flonase [Fluticasone]     Flonase causes hives,rash  . Gabapentin Swelling    ankles swelling.     Review of Systems:       Physical Exam:    Ht 5\' 9"  (1.753 m)   Wt 219 lb (99.3 kg)   BMI 32.34 kg/m  Filed Weights   08/27/18 0903  Weight: 219 lb (99.3 kg)   Tele visit  Data Reviewed: I have personally reviewed following labs and imaging studies  CBC: CBC Latest Ref Rng & Units 02/27/2018 02/22/2017 02/22/2016  WBC 3.8 - 10.8 Thousand/uL 5.8 5.9 5.5  Hemoglobin 11.7 - 15.5 g/dL 13.6 13.3 14.3  Hematocrit 35.0 - 45.0 % 40.4 38.9 44  Platelets 140 - 400 Thousand/uL 302 268 294    CMP: CMP Latest Ref Rng & Units 02/27/2018 02/22/2017 02/22/2016  Glucose 65 - 99 mg/dL 93 106(H) -  BUN 7 - 25 mg/dL 11 14 13   Creatinine 0.60 - 0.93 mg/dL 0.79 0.74 0.8  Sodium 135 - 146 mmol/L 141 139 139  Potassium 3.5 - 5.3 mmol/L 4.5 4.6 4.7  Chloride 98 - 110 mmol/L 104 105 -  CO2 20 - 32 mmol/L 25  23 -  Calcium 8.6 - 10.4 mg/dL 9.7 10.1 -   Total Protein 6.1 - 8.1 g/dL 7.0 6.8 -  Total Bilirubin 0.2 - 1.2 mg/dL 0.7 0.6 -  Alkaline Phos 25 - 125 U/L - - 52  AST 10 - 35 U/L 22 20 26   ALT 6 - 29 U/L 22 22 27      Radiology Studies: No results found.  This service was provided via telemedicine.  The patient was located at home.  The provider was located in office.  The patient did consent to this telephone visit and is aware of possible charges through their insurance for this visit.    Time spent on call: 15 min  Carmell Austria, MD 08/27/2018, 9:28 AM  Cc: Lucille Passy, MD

## 2018-09-11 ENCOUNTER — Telehealth: Payer: Self-pay | Admitting: Gastroenterology

## 2018-09-11 NOTE — Telephone Encounter (Signed)
Spoke with patient regarding Covid-19 screening questions. °Covid-19 Screening Questions: ° °Do you now or have you had a fever in the last 14 days?  ° °Do you have any respiratory symptoms of shortness of breath or cough now or in the last 14 days?  ° °Do you have any family members or close contacts with diagnosed or suspected Covid-19 in the past 14 days?  ° °Have you been tested for Covid-19 and found to be positive?  ° °Pt made aware of that care partner may wait in the car or come up to the lobby during the procedure but will need to provide their own mask. °

## 2018-09-12 ENCOUNTER — Other Ambulatory Visit: Payer: Self-pay

## 2018-09-12 ENCOUNTER — Encounter: Payer: Self-pay | Admitting: Gastroenterology

## 2018-09-12 ENCOUNTER — Ambulatory Visit (AMBULATORY_SURGERY_CENTER): Payer: No Typology Code available for payment source | Admitting: Gastroenterology

## 2018-09-12 VITALS — BP 136/80 | HR 79 | Temp 98.3°F | Resp 10 | Ht 69.5 in | Wt 219.0 lb

## 2018-09-12 DIAGNOSIS — K449 Diaphragmatic hernia without obstruction or gangrene: Secondary | ICD-10-CM

## 2018-09-12 DIAGNOSIS — K2970 Gastritis, unspecified, without bleeding: Secondary | ICD-10-CM

## 2018-09-12 DIAGNOSIS — R197 Diarrhea, unspecified: Secondary | ICD-10-CM

## 2018-09-12 MED ORDER — SODIUM CHLORIDE 0.9 % IV SOLN: 500.0000 mL | Freq: Once | INTRAVENOUS | Status: AC

## 2018-09-12 NOTE — Patient Instructions (Signed)
YOU HAD AN ENDOSCOPIC PROCEDURE TODAY AT [facility]:   Refer to the procedure report that was given to you for any specific questions about what was found during the examination.  If the procedure report does not answer your questions, please call your gastroenterologist to clarify.  If you requested that your care partner not be given the details of your procedure findings, then the procedure report has been included in a sealed envelope for you to review at your convenience later.  YOU SHOULD EXPECT: Some feelings of bloating in the abdomen. Passage of more gas than usual.  Walking can help get rid of the air that was put into your GI tract during the procedure and reduce the bloating. If you had a lower endoscopy (such as a colonoscopy or flexible sigmoidoscopy) you may notice spotting of blood in your stool or on the toilet paper. If you underwent a bowel prep for your procedure, you may not have a normal bowel movement for a few days.  Please Note:  You might notice some irritation and congestion in your nose or some drainage.  This is from the oxygen used during your procedure.  There is no need for concern and it should clear up in a day or so.  SYMPTOMS TO REPORT IMMEDIATELY:   Following upper endoscopy (EGD)  Vomiting of blood or coffee ground material  New chest pain or pain under the shoulder blades  Painful or persistently difficult swallowing  New shortness of breath  Fever of 100F or higher  Black, tarry-looking stools  For urgent or emergent issues, a gastroenterologist can be reached at any hour by calling [phone].   DIET:  We do recommend a small meal at first, but then you may proceed to your regular diet.  Drink plenty of fluids but you should avoid alcoholic beverages for 24 hours.  ACTIVITY:  You should plan to take it easy for the rest of today and you should NOT DRIVE or use heavy machinery until tomorrow (because of the sedation medicines used  during the test).    FOLLOW UP: Our staff will call the number listed on your records 48-72 hours following your procedure to check on you and address any questions or concerns that you may have regarding the information given to you following your procedure. If we do not reach you, we will leave a message.  We will attempt to reach you two times.  During this call, we will ask if you have developed any symptoms of COVID 19. If you develop any symptoms (ie: fever, flu-like symptoms, shortness of breath, cough etc.) before then, please call [phone].  If you test positive for Covid 19 in the 2 weeks post procedure, please call and report this information to [place].    If any biopsies were taken you will be contacted by phone or by letter within the next 1-3 weeks.  Please call us at [phone] if you have not heard about the biopsies in 3 weeks.   Await for biopsy results Gastritis (handout given) Hiatal Hernia (handout given)  SIGNATURES/CONFIDENTIALITY: You and/or your care partner have signed paperwork which will be entered into your electronic medical record.  These signatures attest to the fact that that the information above on your After Visit Summary has been reviewed and is understood.  Full responsibility of the confidentiality of this discharge information lies with you and/or your care-partner.

## 2018-09-12 NOTE — Progress Notes (Signed)
Report to PACU, RN, vss, BBS= Clear.  

## 2018-09-12 NOTE — Op Note (Signed)
[facility] Patient Name: Denise Haas Procedure Date: 09/12/2018 10:45 AM MRN: [id number] Endoscopist: Jackquline Denmark , MD Age: 73 Referring MD:  Date of Birth: [birth year] Gender: Female Account #: [id number] Procedure:                Upper GI endoscopy Indications:              History of diarrhea with positive celiac IgG tTG Medicines:                Monitored Anesthesia Care Procedure:                Pre-Anesthesia Assessment:                           - Prior to the procedure, a History and Physical                            was performed, and patient medications and                            allergies were reviewed. The patient's tolerance of                            previous anesthesia was also reviewed. The risks                            and benefits of the procedure and the sedation                            options and risks were discussed with the patient.                            All questions were answered, and informed consent                            was obtained. Prior Anticoagulants: The patient has                            taken no previous anticoagulant or antiplatelet                            agents. ASA Grade Assessment: II - A patient with                            mild systemic disease. After reviewing the risks                            and benefits, the patient was deemed in                            satisfactory condition to undergo the procedure.                           After obtaining informed consent, the endoscope was  passed under direct vision. Throughout the                            procedure, the patient's blood pressure, pulse, and                            oxygen saturations were monitored continuously. The                            Model GIF-[id number]) scope was introduced                            through the mouth, and advanced to the second part                            of  duodenum. The upper GI endoscopy was                            accomplished without difficulty. The patient                            tolerated the procedure well. Scope In: Scope Out: Findings:                 The examined esophagus was normal. Incidental 2                            inlet patches were noted in the proximal esophagus.                            The Z line was well-defined at 35 cm. Examined by                            NBI.                           A small hiatal hernia was present.                           Localized minimal inflammation characterized by                            erythema was found in the gastric antrum. Biopsies                            were taken with a cold forceps for histology.                           The examined duodenum was normal. Biopsies for                            histology were taken with a cold forceps for                            evaluation of celiac disease.  Estimated blood loss:                            none. Complications:            No immediate complications. Estimated Blood Loss:     Estimated blood loss: none. Impression:               - Small hiatal hernia.                           - Minimal gastritis. Recommendation:           - Patient has a contact number available for                            emergencies. The signs and symptoms of potential                            delayed complications were discussed with the                            patient. Return to normal activities tomorrow.                            Written discharge instructions were provided to the                            patient.                           - Resume previous diet.                           - Continue present medications.                           - Await pathology results. Jackquline Denmark, MD 09/12/2018 11:00:17 AM This report has been signed electronically.

## 2018-09-12 NOTE — Progress Notes (Signed)
Called to room to assist during endoscopic procedure.  Patient ID and intended procedure confirmed with present staff. Received instructions for my participation in the procedure from the performing physician.  

## 2018-09-17 ENCOUNTER — Telehealth: Payer: Self-pay

## 2018-09-17 NOTE — Telephone Encounter (Signed)
Follow up call made, left a voicemail. 

## 2018-09-17 NOTE — Telephone Encounter (Signed)
  Follow up Call-  Call back number 09/12/2018 04/04/2018  Post procedure Call Back phone  # [phone]  Permission to leave phone message Yes Yes  Some recent data might be hidden     Patient questions:  Do you have a fever, pain , or abdominal swelling? No. Pain Score  0 *  Have you tolerated food without any problems? Yes.    Have you been able to return to your normal activities? Yes.    Do you have any questions about your discharge instructions: Diet   No. Medications  No. Follow up visit  No.  Do you have questions or concerns about your Care? No.  Actions: * If pain score is 4 or above: No action needed, pain <4.  1. Have you developed a fever since your procedure? No  2.   Have you had an respiratory symptoms (SOB or cough) since your procedure? No  3.   Have you tested positive for COVID 19 since your procedure No  4.   Have you had any family members/close contacts diagnosed with the COVID 19 since your procedure?  No   If yes to any of these questions please route to Joylene John, RN and Alphonsa Gin, RN.

## 2018-09-23 ENCOUNTER — Encounter: Payer: Self-pay | Admitting: Gastroenterology

## 2018-09-23 NOTE — Telephone Encounter (Signed)
Please advise 

## 2019-03-04 ENCOUNTER — Other Ambulatory Visit: Payer: Self-pay

## 2019-03-04 ENCOUNTER — Encounter: Payer: No Typology Code available for payment source | Admitting: Family Medicine

## 2019-03-04 DIAGNOSIS — R197 Diarrhea, unspecified: Secondary | ICD-10-CM | POA: Insufficient documentation

## 2019-03-04 NOTE — Progress Notes (Signed)
Subjective:    Patient ID: Denise Haas, female    DOB: Aug 04, 1945, 73 y.o.   MRN: [id number]  Chief Complaint  Patient presents with  . Annual Exam    Pt fasting for lab work.  Labs need to be sent to [facility].  . Follow-up    HPI Patient is in today for CPX and follow up of chronic medical conditions.  Health Maintenance  Topic Date Due  . MAMMOGRAM  04/26/2020  . Fecal DNA (Cologuard)  03/04/2021  . TETANUS/TDAP  12/04/2027  . INFLUENZA VACCINE  Completed  . DEXA SCAN  Completed  . Hepatitis C Screening  Completed  . PNA vac Low Risk Adult  Completed    Has GYN, Dr. Julien Girt.  Denies any post menopausal bleeding. Per pt, both mammogram and BMD done at her office and are UTD- was seen in 04/2018.  Saw dermatologist within the year.   Depression screen [facility] 2/9 03/05/2019 02/27/2018 02/22/2017 02/21/2016 02/16/2015  Decreased Interest 0 0 0 0 0  Down, Depressed, Hopeless 0 0 0 0 0  PHQ - 2 Score 0 0 0 0 0  Altered sleeping - 0 - - -  Tired, decreased energy - 0 - - -  Change in appetite - 1 - - -  Feeling bad or failure about yourself  - 0 - - -  Trouble concentrating - 0 - - -  Moving slowly or fidgety/restless - 0 - - -  Suicidal thoughts - 0 - - -  PHQ-9 Score - 1 - - -  Difficult doing work/chores - Not difficult at all - - -   GAD 7 : Generalized Anxiety Score 02/27/2018  Nervous, Anxious, on Edge 1  Control/stop worrying 1  Worry too much - different things 1  Trouble relaxing 0  Restless 0  Easily annoyed or irritable 0  Afraid - awful might happen 1  Total GAD 7 Score 4  Anxiety Difficulty Somewhat difficult      Cologuard was positive on 03/04/18 so I referred hher to GI- Dr. Lyndel Safe. Colonoscopy was done on 04/04/18- reviewed- small diverticuli but otherwise normal colonoscopy.  Diarrhea with positive celiac screen- saw Dr. Lyndel Safe for this on 08/27/18 via televisit.  Note reviewed. She recommended EGD which was done on 09/12/18-  EGD biopsies neg for  celiac and h pylori.   Dr. Lyndel Safe recommended the following in mychart message to patient:  "Hi Denise Denise, [name] do not have celiac disease on biopsies. You may have gluten intolerance.  Now if you have any abdominal pain/diarrhea, then I would recommend gluten-free diet for 2 weeks to see how you do.  If you do well, then we would continue on gluten-free diet.  If you are not having any abdominal problems at this time, then continue normal diet.  I know it is quite confusing. "You are one of a kind".  We do have a genetic test for celiac.  We can talk more about it if you have problems in the future.  That is even more confusing.  Means-negative test would rule out celiac but positive test does not have much value."        Past Medical History:  Diagnosis Date  . Abnormal liver function test 02/20/2015  . Acne   . Acute bronchitis 02/20/2015  . Allergy    multiple drug allergies  . Anxiety   . Arthritis   . Cataract   . Diarrhea 03/29/2018   with gas for  over a month  . GERD (gastroesophageal reflux disease)    no meds  . Glaucoma   . H/O measles   . H/O mumps   . History of chicken pox   . History of colonic polyps   . History of colonic polyps 02/21/2016  . Neuromuscular disorder ([place])    09/12/18 pt. denies  . Osteopenia   . Seborrhea capitis 08/28/2016    Past Surgical History:  Procedure Laterality Date  . [place]   cage placed in lumbar  . BREAST SURGERY  1980's   benign left breast biopsy for cyst  . COLONOSCOPY    . EYE SURGERY     cataracts b/l    Family History  Problem Relation Age of Onset  . Cancer Mother        breast  . Heart disease Mother   . Diabetes Mother   . Asthma Mother   . Hyperlipidemia Mother   . Peripheral vascular disease Mother   . COPD Father   . Heart disease Father   . Arthritis Father   . Stroke Maternal Grandmother   . Heart disease Maternal Grandmother   . Heart disease Maternal Grandfather   . Heart  attack Maternal Grandfather   . Cancer Paternal Grandfather        throat  . Glaucoma Sister   . Glaucoma Brother   . Heart disease Brother   . Alcohol abuse Brother   . Depression Brother   . Hyperlipidemia Brother   . COPD Daughter   . Glaucoma Brother   . Cancer Brother        skin cancer, lost tip of thumb  . Glaucoma Brother   . Crohn's disease Brother        colostomy in place  . Cancer Cousin        colon   . Colon cancer Cousin   . Esophageal cancer Neg Hx   . Rectal cancer Neg Hx   . Stomach cancer Neg Hx     Social History   Socioeconomic History  . Marital status: Married    Spouse name: Not on file  . Number of children: 2  . Years of education: Not on file  . Highest education level: Not on file  Occupational History  . Not on file  Tobacco Use  . Smoking status: Never Smoker  . Smokeless tobacco: Never Used  Substance and Sexual Activity  . Alcohol use: Yes    Alcohol/week: 14.0 standard drinks    Types: 14 Glasses of wine per week  . Drug use: No  . 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 no dietary restrictions. wears seat belt  Other Topics Concern  . Not on file  Social History Narrative  . Not on file   Social Determinants of Health   Financial Resource Strain:   . Difficulty of Paying Living Expenses: Not on file  Food Insecurity:   . Worried About Charity fundraiser in the Last Year: Not on file  . Ran Out of Food in the Last Year: Not on file  Transportation Needs:   . Lack of Transportation (Medical): Not on file  . Lack of Transportation (Non-Medical): Not on file  Physical Activity:   . Days of Exercise per Week: Not on file  . Minutes of Exercise per Session: Not on file  Stress:   . Feeling of Stress : Not on file  Social Connections:   .  Frequency of Communication with Friends and Family: Not on file  . Frequency of Social Gatherings with Friends and Family: Not on file  . Attends  Religious Services: Not on file  . Active Member of Clubs or Organizations: Not on file  . Attends Archivist Meetings: Not on file  . Marital Status: Not on file  Intimate Partner Violence:   . Fear of Current or Ex-Partner: Not on file  . Emotionally Abused: Not on file  . Physically Abused: Not on file  . Sexually Abused: Not on file    Outpatient Medications Prior to Visit  Medication Sig Dispense Refill  . Biotin 300 MCG TABS Take by mouth.    . Calcium Carb-Cholecalciferol (CALCIUM + D3) 600-200 MG-UNIT TABS Take 1 tablet by mouth 2 (two) times daily.    . Cholecalciferol (VITAMIN D) 2000 units tablet Take by mouth.    . clobetasol (OLUX) 0.05 % topical foam     . folic acid (FOLVITE) [id number] MCG tablet Take 800 mcg by mouth daily.    Marland Kitchen ketoconazole (NIZORAL) 2 % shampoo     . latanoprost (XALATAN) 0.005 % ophthalmic solution Place 1 drop into both eyes.     . Melatonin 2.5 MG CAPS Take 1 capsule by mouth Nightly.    . Multiple Vitamin (MULTIVITAMIN) capsule Take 1 capsule by mouth daily.    Marland Kitchen nystatin-triamcinolone ointment (MYCOLOG) nystatin-triamcinolone 100,000 unit/gram-0.1 % topical ointment  APPLY TO THE AFFECTED AREA(S) BY TOPICAL ROUTE 2 TIMES PER DAY    . OVER THE COUNTER MEDICATION Hylaurionic Acid-Take 2 capsules a day For arthritis    . Polyvinyl Alcohol-Povidone (REFRESH OP) Apply to eye. Apply one drop each eye prn dry eyes    . Risedronate Sodium 35 MG TBEC Take by mouth.    . tretinoin (RETIN-A) 0.025 % cream Apply topically at bedtime. 135 g 3  . Cyanocobalamin (B-12) 2500 MCG TABS Take 1 tablet by mouth daily.    Marland Kitchen loratadine (CLARITIN) 10 MG tablet Take 1 tablet (10 mg total) by mouth daily. 14 tablet 0  . Misc Natural Products (TART CHERRY ADVANCED PO) Take 1,200 mg by mouth daily.    . Riboflavin (VITAMIN B2 PO) Take by mouth.     Facility-Administered Medications Prior to Visit  Medication Dose Route Frequency Provider Last Rate Last Admin  . 0.9  %  sodium chloride infusion  500 mL Intravenous Once Jackquline Denmark, MD        Allergies  Allergen Reactions  . Amoxicillin Hives  . Aspirin Hives  . Erythromycin Hives and Rash  . Ibuprofen Hives  . Latex Rash  . Tetracyclines & Related Hives  . Triple Antibiotic [Bacitracin-Neomycin-Polymyxin] Rash  . Codeine     Codeine cough syrup cause a rash and hives  . Flonase [Fluticasone]     Flonase causes hives,rash  . Gabapentin Swelling    ankles swelling.     Review of Systems  Constitutional: Negative for fever and malaise/fatigue.  HENT: Negative.  Negative for congestion and hearing loss.   Eyes: Negative.  Negative for blurred vision, discharge and redness.  Respiratory: Negative.  Negative for cough and shortness of breath.   Cardiovascular: Negative.  Negative for chest pain, palpitations and leg swelling.  Gastrointestinal: Negative.  Negative for abdominal pain and heartburn.  Genitourinary: Negative.  Negative for dysuria.  Musculoskeletal: Negative.  Negative for falls.  Skin: Negative.  Negative for rash.  Neurological: Negative.  Negative for loss of consciousness and  headaches.  Endo/Heme/Allergies: Negative.  Does not bruise/bleed easily.  Psychiatric/Behavioral: Negative.  Negative for depression.  All other systems reviewed and are negative.      Objective:    Physical Exam Vitals and nursing note reviewed.  Constitutional:      Appearance: Normal appearance. She is not ill-appearing.  HENT:     Head: Normocephalic and atraumatic.     Right Ear: External ear normal.     Left Ear: External ear normal.     Nose: Nose normal.  Eyes:     General:        Right eye: No discharge.        Left eye: No discharge.  Cardiovascular:     Rate and Rhythm: Normal rate and regular rhythm.     Heart sounds: Normal heart sounds.  Pulmonary:     Effort: Pulmonary effort is normal.     Breath sounds: No wheezing.  Abdominal:     Palpations: Abdomen is soft. There  is no mass.     Tenderness: There is no abdominal tenderness. There is no guarding.  Musculoskeletal:        General: Normal range of motion.     Right lower leg: No edema.     Left lower leg: No edema.  Skin:    General: Skin is dry.  Neurological:     Mental Status: She is alert and oriented to person, place, and time.     Deep Tendon Reflexes: Reflexes normal.  Psychiatric:        Mood and Affect: Mood normal.        Behavior: Behavior normal.     BP 120/80 (BP Location: Left Arm, Patient Position: Sitting, Cuff Size: Normal)   Pulse 80   Temp (!) 95.9 F (35.5 C) (Temporal)   Ht 5' 9.75" (1.772 m)   Wt 225 lb 9.6 oz (102.3 kg)   SpO2 (!) 7%   BMI 32.60 kg/m  Wt Readings from Last 3 Encounters:  03/05/19 225 lb 9.6 oz (102.3 kg)  09/12/18 219 lb (99.3 kg)  08/27/18 219 lb (99.3 kg)     Lab Results  Component Value Date   WBC 5.8 02/27/2018   HGB 13.6 02/27/2018   HCT 40.4 02/27/2018   PLT 302 02/27/2018   GLUCOSE 93 02/27/2018   CHOL 194 02/27/2018   TRIG 144 02/27/2018   HDL 61 02/27/2018   LDLCALC 107 (H) 02/27/2018   ALT 22 02/27/2018   AST 22 02/27/2018   NA 141 02/27/2018   K 4.5 02/27/2018   CL 104 02/27/2018   CREATININE 0.79 02/27/2018   BUN 11 02/27/2018   CO2 25 02/27/2018   TSH 0.99 02/27/2018    Lab Results  Component Value Date   TSH 0.99 02/27/2018   Lab Results  Component Value Date   WBC 5.8 02/27/2018   HGB 13.6 02/27/2018   HCT 40.4 02/27/2018   MCV 94.2 02/27/2018   PLT 302 02/27/2018   Lab Results  Component Value Date   NA 141 02/27/2018   K 4.5 02/27/2018   CO2 25 02/27/2018   GLUCOSE 93 02/27/2018   BUN 11 02/27/2018   CREATININE 0.79 02/27/2018   BILITOT 0.7 02/27/2018   ALKPHOS 52 02/22/2016   AST 22 02/27/2018   ALT 22 02/27/2018   PROT 7.0 02/27/2018   ALBUMIN 3.9 02/16/2015   CALCIUM 9.7 02/27/2018   Lab Results  Component Value Date   CHOL 194 02/27/2018   Lab  Results  Component Value Date   HDL  61 02/27/2018   Lab Results  Component Value Date   LDLCALC 107 (H) 02/27/2018   Lab Results  Component Value Date   TRIG 144 02/27/2018   Lab Results  Component Value Date   CHOLHDL 3.2 02/27/2018   No results found for: HGBA1C     Assessment & Plan:   Problem List Items Addressed This Visit      Active Problems   Abnormal liver function test   Hyperlipidemia, mixed    Encouraged heart healthy diet, increase exercise, avoid trans fats, consider a krill oil cap daily  Not on a statin.  Repeat labs today. Lab Results  Component Value Date   CHOL 194 02/27/2018   HDL 61 02/27/2018   LDLCALC 107 (H) 02/27/2018   TRIG 144 02/27/2018   CHOLHDL 3.2 02/27/2018          Relevant Orders   Well woman- non DM- CBC   Well woman- non DM- CMET   Well woman- non DM- lipid   well woman- non DM- TSH   Well woman exam without gynecological exam - Primary    Reviewed preventive care protocols, scheduled due services, and updated immunizations Discussed nutrition, exercise, diet, and healthy lifestyle.       Positive colorectal cancer screening using Cologuard test    Follow up colonoscopy neg.      Diarrhea    Resolved.      Relevant Orders   Iron, TIBC and Ferritin Panel    Other Visit Diagnoses    Vitamin D deficiency       Relevant Orders   Vitamin D (25 hydroxy)       I have discontinued Denise Haas's B-12, Riboflavin (VITAMIN B2 PO), Misc Natural Products (TART CHERRY ADVANCED PO), and loratadine. I am also having her maintain her latanoprost, Calcium + D3, Melatonin, Polyvinyl Alcohol-Povidone (REFRESH OP), OVER THE COUNTER MEDICATION, multivitamin, folic acid, Biotin, Vitamin D, Risedronate Sodium, tretinoin, ketoconazole, nystatin-triamcinolone ointment, and clobetasol. We will continue to administer sodium chloride.  No orders of the defined types were placed in this encounter.   This visit occurred during the SARS-CoV-2 public health emergency.   Safety protocols were in place, including screening questions prior to the visit, additional usage of staff PPE, and extensive cleaning of exam room while observing appropriate contact time as indicated for disinfecting solutions.    Arnette Norris, MD

## 2019-03-05 ENCOUNTER — Encounter: Payer: Self-pay | Admitting: Family Medicine

## 2019-03-05 ENCOUNTER — Ambulatory Visit (INDEPENDENT_AMBULATORY_CARE_PROVIDER_SITE_OTHER): Payer: No Typology Code available for payment source | Admitting: Family Medicine

## 2019-03-05 VITALS — BP 120/80 | HR 80 | Temp 95.9°F | Ht 69.75 in | Wt 225.6 lb

## 2019-03-05 DIAGNOSIS — R197 Diarrhea, unspecified: Secondary | ICD-10-CM | POA: Diagnosis not present

## 2019-03-05 DIAGNOSIS — R195 Other fecal abnormalities: Secondary | ICD-10-CM | POA: Diagnosis not present

## 2019-03-05 DIAGNOSIS — E782 Mixed hyperlipidemia: Secondary | ICD-10-CM

## 2019-03-05 DIAGNOSIS — R945 Abnormal results of liver function studies: Secondary | ICD-10-CM

## 2019-03-05 DIAGNOSIS — Z0000 Encounter for general adult medical examination without abnormal findings: Secondary | ICD-10-CM | POA: Diagnosis not present

## 2019-03-05 DIAGNOSIS — E559 Vitamin D deficiency, unspecified: Secondary | ICD-10-CM

## 2019-03-05 DIAGNOSIS — R7989 Other specified abnormal findings of blood chemistry: Secondary | ICD-10-CM

## 2019-03-05 NOTE — Patient Instructions (Signed)
Great to see you. I will call you with your lab results from today and you can view them online.   Happy  Holidays!

## 2019-03-05 NOTE — Assessment & Plan Note (Addendum)
Follow up colonoscopy neg. Advised follow up cologuard in 3 years.

## 2019-03-05 NOTE — Assessment & Plan Note (Addendum)
Resolved when she cut back a bit on gluten.

## 2019-03-05 NOTE — Assessment & Plan Note (Signed)
Encouraged heart healthy diet, increase exercise, avoid trans fats, consider a krill oil cap daily  Not on a statin.  Repeat labs today. Lab Results  Component Value Date   CHOL 194 02/27/2018   HDL 61 02/27/2018   LDLCALC 107 (H) 02/27/2018   TRIG 144 02/27/2018   CHOLHDL 3.2 02/27/2018

## 2019-03-05 NOTE — Assessment & Plan Note (Signed)
Reviewed preventive care protocols, scheduled due services, and updated immunizations Discussed nutrition, exercise, diet, and healthy lifestyle.  

## 2019-03-06 LAB — CBC WITH DIFFERENTIAL/PLATELET
Absolute Monocytes: 434 cells/uL (ref 200–950)
Basophils Absolute: 20 cells/uL (ref 0–200)
Basophils Relative: 0.4 %
Eosinophils Absolute: 133 cells/uL (ref 15–500)
Eosinophils Relative: 2.6 %
HCT: 40 % (ref 35.0–45.0)
Hemoglobin: 13.4 g/dL (ref 11.7–15.5)
Lymphs Abs: 1994 cells/uL (ref 850–3900)
MCH: 31.1 pg (ref 27.0–33.0)
MCHC: 33.5 g/dL (ref 32.0–36.0)
MCV: 92.8 fL (ref 80.0–100.0)
MPV: 10.5 fL (ref 7.5–12.5)
Monocytes Relative: 8.5 %
Neutro Abs: 2519 cells/uL (ref 1500–7800)
Neutrophils Relative %: 49.4 %
Platelets: 302 10*3/uL (ref 140–400)
RBC: 4.31 10*6/uL (ref 3.80–5.10)
RDW: 12.5 % (ref 11.0–15.0)
Total Lymphocyte: 39.1 %
WBC: 5.1 10*3/uL (ref 3.8–10.8)

## 2019-03-06 LAB — COMPREHENSIVE METABOLIC PANEL
AG Ratio: 1.6 (calc) (ref 1.0–2.5)
ALT: 22 U/L (ref 6–29)
AST: 18 U/L (ref 10–35)
Albumin: 4.2 g/dL (ref 3.6–5.1)
Alkaline phosphatase (APISO): 46 U/L (ref 37–153)
BUN: 12 mg/dL (ref 7–25)
CO2: 25 mmol/L (ref 20–32)
Calcium: 9.6 mg/dL (ref 8.6–10.4)
Chloride: 105 mmol/L (ref 98–110)
Creat: 0.77 mg/dL (ref 0.60–0.93)
Globulin: 2.7 g/dL (calc) (ref 1.9–3.7)
Glucose, Bld: 93 mg/dL (ref 65–99)
Potassium: 4.5 mmol/L (ref 3.5–5.3)
Sodium: 141 mmol/L (ref 135–146)
Total Bilirubin: 0.7 mg/dL (ref 0.2–1.2)
Total Protein: 6.9 g/dL (ref 6.1–8.1)

## 2019-03-06 LAB — VITAMIN D 25 HYDROXY (VIT D DEFICIENCY, FRACTURES): Vit D, 25-Hydroxy: 35 ng/mL (ref 30–100)

## 2019-03-06 LAB — LIPID PANEL
Cholesterol: 210 mg/dL — ABNORMAL HIGH (ref <200)
HDL: 57 mg/dL (ref >=50)
LDL Cholesterol (Calc): 129 mg/dL (calc) — ABNORMAL HIGH
Non-HDL Cholesterol (Calc): 153 mg/dL (calc) — ABNORMAL HIGH (ref <130)
Total CHOL/HDL Ratio: 3.7 (calc) (ref <5.0)
Triglycerides: 126 mg/dL (ref <150)

## 2019-03-06 LAB — IRON,TIBC AND FERRITIN PANEL
%SAT: 29 % (calc) (ref 16–45)
Ferritin: 104 ng/mL (ref 16–288)
Iron: 102 ug/dL (ref 45–160)
TIBC: 355 mcg/dL (calc) (ref 250–450)

## 2019-03-06 LAB — TSH: TSH: 1.53 mIU/L (ref 0.40–4.50)

## 2019-04-01 ENCOUNTER — Ambulatory Visit: Payer: No Typology Code available for payment source | Attending: Internal Medicine

## 2019-04-01 DIAGNOSIS — Z23 Encounter for immunization: Secondary | ICD-10-CM | POA: Insufficient documentation

## 2019-04-01 NOTE — Progress Notes (Signed)
   Covid-19 Vaccination Clinic  Name:  KINZLI TAUZIN    MRN: [id number] DOB: 1945-06-17  04/01/2019  Ms. Fuster was observed post Covid-19 immunization for 15 minutes without incidence. She was provided with Vaccine Information Sheet and instruction to access the V-Safe system.   Ms. Lions was instructed to call 911 with any severe reactions post vaccine: Marland Kitchen Difficulty breathing  . Swelling of your face and throat  . A fast heartbeat  . A bad rash all over your body  . Dizziness and weakness    Immunizations Administered    Name Date Dose VIS Date Route   Pfizer COVID-19 Vaccine 04/01/2019 12:15 PM 0.3 mL 02/21/2019 Intramuscular   Manufacturer: [employer]   Lot: [id number]   [place]: [id number]

## 2019-04-21 ENCOUNTER — Ambulatory Visit: Payer: No Typology Code available for payment source | Attending: Internal Medicine

## 2019-04-21 DIAGNOSIS — Z23 Encounter for immunization: Secondary | ICD-10-CM

## 2019-04-21 NOTE — Progress Notes (Signed)
   Covid-19 Vaccination Clinic  Name:  JESSIKA CADDICK    MRN: [id number] DOB: 1945/12/31  04/21/2019  Ms. Castner was observed post Covid-19 immunization for 15 minutes without incidence. She was provided with Vaccine Information Sheet and instruction to access the V-Safe system.   Ms. Pallares was instructed to call 911 with any severe reactions post vaccine: Marland Kitchen Difficulty breathing  . Swelling of your face and throat  . A fast heartbeat  . A bad rash all over your body  . Dizziness and weakness    Immunizations Administered    Name Date Dose VIS Date Route   Pfizer COVID-19 Vaccine 04/21/2019  8:36 AM 0.3 mL 02/21/2019 Intramuscular   Manufacturer: [place]   Lot: [id number]   [place]: [id number]

## 2019-05-29 ENCOUNTER — Other Ambulatory Visit: Payer: Self-pay

## 2019-05-29 ENCOUNTER — Telehealth (INDEPENDENT_AMBULATORY_CARE_PROVIDER_SITE_OTHER): Payer: No Typology Code available for payment source | Admitting: Nurse Practitioner

## 2019-05-29 ENCOUNTER — Encounter: Payer: Self-pay | Admitting: Nurse Practitioner

## 2019-05-29 VITALS — Ht 69.75 in

## 2019-05-29 DIAGNOSIS — S39012A Strain of muscle, fascia and tendon of lower back, initial encounter: Secondary | ICD-10-CM

## 2019-05-29 MED ORDER — TRAMADOL-ACETAMINOPHEN 37.5-325 MG PO TABS: 1.0000 | ORAL_TABLET | Freq: Three times a day (TID) | ORAL | 0 refills | Status: AC | PRN

## 2019-05-29 MED ORDER — CYCLOBENZAPRINE HCL 5 MG PO TABS: 5.0000 mg | ORAL_TABLET | Freq: Two times a day (BID) | ORAL | 0 refills | Status: DC | PRN

## 2019-05-29 NOTE — Progress Notes (Signed)
Virtual Visit via Video Note  I connected with@ on 05/29/19 at  9:00 AM EDT by a video enabled telemedicine application and verified that I am speaking with the correct person using two identifiers.  Location: Patient:Home Provider: Office Participants: patient and provider  I discussed the limitations of evaluation and management by telemedicine and the availability of in person appointments. I also discussed with the patient that there may be a patient responsible charge related to this service. The patient expressed understanding and agreed to proceed.  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   History of Present Illness: Back Pain This is a new problem. The current episode started in the past 7 days. The problem occurs constantly. The problem is unchanged. The pain is present in the lumbar spine. The quality of the pain is described as aching and cramping. The pain does not radiate. The pain is severe. The pain is the same all the time. The symptoms are aggravated by sitting and twisting. Stiffness is present in the morning. Associated symptoms include tingling. Pertinent negatives include no abdominal pain, bladder incontinence, bowel incontinence, dysuria, fever, headaches, leg pain, numbness, paresis, paresthesias, pelvic pain, perianal numbness, weakness or weight loss. Risk factors include obesity, lack of exercise, menopause, poor posture and sedentary lifestyle. She has tried bed rest and NSAIDs for the symptoms. The treatment provided no relief.  unable to to take high dose of NSAIDs (cause hives)   Reviewed medication, allergy and problem list. Also reviewed previous lab results.  Observations/Objective: Physical Exam  Constitutional: She is oriented to person, place, and time. No distress.  Pulmonary/Chest: Effort normal.  Musculoskeletal:     Lumbar back:  Tenderness present. Normal range of motion.       Back:     Comments: Normal gait.  Neurological: She is alert and oriented to person, place, and time.    Assessment and Plan: Denise Haas was seen today for back pain.  Diagnoses and all orders for this visit:  Strain of lumbar paraspinous muscle, initial encounter -     cyclobenzaprine (FLEXERIL) 5 MG tablet; Take 1 tablet (5 mg total) by mouth every 12 (twelve) hours as needed for muscle spasms. -     traMADol-acetaminophen (ULTRACET) 37.5-325 MG tablet; Take 1 tablet by mouth every 8 (eight) hours as needed for up to 3 days.   Follow Up Instructions: See avs   I discussed the assessment and treatment plan with the patient. The patient was provided an opportunity to ask questions and all were answered. The patient agreed with the plan and demonstrated an understanding of the instructions.   The patient was advised to call back or seek an in-person evaluation if the symptoms worsen or if the condition fails to improve as anticipated.  Wilfred Lacy, NP

## 2019-06-16 ENCOUNTER — Other Ambulatory Visit: Payer: Self-pay

## 2019-06-16 ENCOUNTER — Encounter (INDEPENDENT_AMBULATORY_CARE_PROVIDER_SITE_OTHER): Payer: Self-pay | Admitting: Ophthalmology

## 2019-06-16 ENCOUNTER — Ambulatory Visit (INDEPENDENT_AMBULATORY_CARE_PROVIDER_SITE_OTHER): Payer: No Typology Code available for payment source | Admitting: Ophthalmology

## 2019-06-16 DIAGNOSIS — H43811 Vitreous degeneration, right eye: Secondary | ICD-10-CM | POA: Diagnosis not present

## 2019-06-16 DIAGNOSIS — H35372 Puckering of macula, left eye: Secondary | ICD-10-CM

## 2019-06-16 DIAGNOSIS — H43812 Vitreous degeneration, left eye: Secondary | ICD-10-CM | POA: Diagnosis not present

## 2019-06-16 NOTE — Assessment & Plan Note (Signed)
The nature of macular pucker was discussed with the patient as well as threshold criteria for vitrectomy surgery. I explained that in rare cases another surgery is needed to actually remove a second wrinkle should it regrow.  Most often, the epiretinal membrane and underlying wrinkled internal limiting membrane are removed, to accomplish the goals.   If the operative eye is [place], cataract surgery is often recommended prior to Vitrectomy. This will enable the surgeon to have the best view during surgery and the patient optimal results in the future. Treatment options were discussed. OS STABLE OVER THE LAST 2 YEARS. WILL OBSERVE.

## 2019-06-16 NOTE — Assessment & Plan Note (Signed)
The nature of posterior vitreous detachment was discussed with the patient as well as its physiology, its age prevalence, and its possible implication regarding retinal breaks and detachment.  An informational brochure was given to the patient.  All the patient's questions were answered.  The patient was asked to return if new or different flashes or floaters develops.   Patient was instructed to contact office immediately if any changes were noticed. I explained to the patient that vitreous inside the eye is similar to jello inside a bowl. As the jello melts it can start to pull away from the bowl, similarly the vitreous throughout our lives can begin to pull away from the retina. That process is called a posterior vitreous detachment. In some cases, the vitreous can tug hard enough on the retina to form a retinal tear. I discussed with the patient the signs and symptoms of a retinal detachment.  Do not rub the eye.  OU,, MINOR, STABLE

## 2020-03-10 ENCOUNTER — Encounter: Payer: No Typology Code available for payment source | Admitting: Family Medicine

## 2020-03-17 ENCOUNTER — Other Ambulatory Visit: Payer: Self-pay

## 2020-03-18 ENCOUNTER — Ambulatory Visit (INDEPENDENT_AMBULATORY_CARE_PROVIDER_SITE_OTHER): Payer: No Typology Code available for payment source | Admitting: Family Medicine

## 2020-03-18 ENCOUNTER — Encounter: Payer: Self-pay | Admitting: Family Medicine

## 2020-03-18 VITALS — BP 124/76 | HR 74 | Temp 98.5°F | Ht 69.0 in | Wt 226.6 lb

## 2020-03-18 DIAGNOSIS — E782 Mixed hyperlipidemia: Secondary | ICD-10-CM | POA: Diagnosis not present

## 2020-03-18 DIAGNOSIS — E559 Vitamin D deficiency, unspecified: Secondary | ICD-10-CM

## 2020-03-18 DIAGNOSIS — Z0000 Encounter for general adult medical examination without abnormal findings: Secondary | ICD-10-CM

## 2020-03-18 MED ORDER — TRETINOIN 0.025 % EX CREA
TOPICAL_CREAM | Freq: Every day | CUTANEOUS | 3 refills | Status: AC
Start: 2020-03-18 — End: ?

## 2020-03-18 NOTE — Progress Notes (Signed)
Denise Haas is a 75 y.o. female  Chief Complaint  Patient presents with  . Establish Care    TOC- CPE/labs. Not fasting today.   C/o off/on back pain.     HPI: Denise Haas is a 75 y.o. female, who was previously a patient of Dr. Dayton Martes, seen today for annual CPE and labs. Pt's husband is currently being treated for lung cancer which is obviously stressful for pt.   Last mammo: UTD - follows with GYN Last Dexa: due but pt declined when offered by GYN and again today. She will do next year Last colonoscopy: 03/2018 - Dr. Chales Abrahams w/ [facility] - due in 03/2028  Dental: UTD Vision: UTD   Past Medical History:  Diagnosis Date  . Abnormal liver function test 02/20/2015  . Acne   . Acute bronchitis 02/20/2015  . Allergy    multiple drug allergies  . Anxiety   . Arthritis   . Cataract   . Diarrhea 03/29/2018   with gas for over a month  . GERD (gastroesophageal reflux disease)    no meds  . Glaucoma   . H/O measles   . H/O mumps   . History of chicken pox   . History of colonic polyps   . History of colonic polyps 02/21/2016  . Neuromuscular disorder ([facility])    09/12/18 pt. denies  . Osteopenia   . Seborrhea capitis 08/28/2016    Past Surgical History:  Procedure Laterality Date  . BACK SURGERY  1995   cage placed in lumbar  . BREAST SURGERY  1980's   benign left breast biopsy for cyst  . COLONOSCOPY    . EYE SURGERY     cataracts b/l    Social History   Socioeconomic History  . Marital status: Married    Spouse name: Not on file  . Number of children: 2  . Years of education: Not on file  . Highest education level: Not on file  Occupational History  . Not on file  Tobacco Use  . Smoking status: Never Smoker  . Smokeless tobacco: Never Used  Vaping Use  . Vaping Use: Never used  Substance and Sexual Activity  . Alcohol use: Yes    Alcohol/week: 14.0 standard drinks    Types: 14 Glasses of wine per week  . Drug use: No  . 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 no dietary restrictions. wears seat belt  Other Topics Concern  . Not on file  Social History Narrative  . Not on file   Social Determinants of Health   Financial Resource Strain: Not on file  Food Insecurity: Not on file  Transportation Needs: Not on file  Physical Activity: Not on file  Stress: Not on file  Social Connections: Not on file  Intimate Partner Violence: Not on file    Family History  Problem Relation Age of Onset  . Cancer Mother        breast  . Heart disease Mother   . Diabetes Mother   . Asthma Mother   . Hyperlipidemia Mother   . Peripheral vascular disease Mother   . COPD Father   . Heart disease Father   . Arthritis Father   . Stroke Maternal Grandmother   . Heart disease Maternal Grandmother   . Heart disease Maternal Grandfather   . Heart attack Maternal Grandfather   . Cancer Paternal Grandfather        throat  .  Glaucoma Sister   . Glaucoma Brother   . Heart disease Brother   . Alcohol abuse Brother   . Depression Brother   . Hyperlipidemia Brother   . COPD Daughter   . Glaucoma Brother   . Cancer Brother        skin cancer, lost tip of thumb  . Glaucoma Brother   . Crohn's disease Brother        colostomy in place  . Cancer Cousin        colon   . Colon cancer Cousin   . Esophageal cancer Neg Hx   . Rectal cancer Neg Hx   . Stomach cancer Neg Hx      Immunization History  Administered Date(s) Administered  . H1N1 12/29/2010  . Influenza Split 11/25/2015  . Influenza, High Dose Seasonal PF 11/19/2017, 11/19/2017  . Influenza, Quadrivalent, Recombinant, Inj, Pf 12/13/2016  . Influenza,inj,Quad PF,6+ Mos 12/11/2012, 01/05/2014, 11/15/2018  . Influenza-Unspecified 11/17/2009, 01/08/2012, 12/12/2014  . PFIZER SARS-COV-2 Vaccination 04/01/2019, 04/21/2019, 12/16/2019  . Pneumococcal Conjugate-13 02/16/2015  . Pneumococcal Polysaccharide-23 01/08/2012, 05/09/2013  . Tdap 12/20/2009,  12/03/2017  . Zoster 10/09/2009  . Zoster Recombinat (Shingrix) 10/01/2017, 10/26/2017, 12/03/2017    Outpatient Encounter Medications as of 03/18/2020  Medication Sig Note  . Biotin 300 MCG TABS Take by mouth.   . Calcium Carb-Cholecalciferol (CALCIUM + D3) 600-200 MG-UNIT TABS Take 1 tablet by mouth 2 (two) times daily.   Marland Kitchen latanoprost (XALATAN) 0.005 % ophthalmic solution Place 1 drop into both eyes.  05/09/2013: Received from: External Pharmacy  . Melatonin 2.5 MG CAPS Take 1 capsule by mouth Nightly.   . nystatin-triamcinolone ointment (MYCOLOG) nystatin-triamcinolone 100,000 unit/gram-0.1 % topical ointment  APPLY TO THE AFFECTED AREA(S) BY TOPICAL ROUTE 2 TIMES PER DAY   . OVER THE COUNTER MEDICATION Hylaurionic Acid-Take 2 capsules a day For arthritis   . Polyvinyl Alcohol-Povidone (REFRESH OP) Apply to eye. Apply one drop each eye prn dry eyes   . [DISCONTINUED] tretinoin (RETIN-A) 0.025 % cream Apply topically at bedtime.   . clobetasol (OLUX) 0.05 % topical foam    . tretinoin (RETIN-A) 0.025 % cream Apply topically at bedtime.   . [DISCONTINUED] Cholecalciferol (VITAMIN D) 2000 units tablet Take by mouth. (Patient not taking: Reported on 03/18/2020)   . [DISCONTINUED] cyclobenzaprine (FLEXERIL) 5 MG tablet Take 1 tablet (5 mg total) by mouth every 12 (twelve) hours as needed for muscle spasms.   . [DISCONTINUED] folic acid (FOLVITE) Q000111Q MCG tablet Take 800 mcg by mouth daily. (Patient not taking: Reported on 03/18/2020)   . [DISCONTINUED] ketoconazole (NIZORAL) 2 % shampoo    . [DISCONTINUED] Multiple Vitamin (MULTIVITAMIN) capsule Take 1 capsule by mouth daily. (Patient not taking: Reported on 03/18/2020)   . [DISCONTINUED] Risedronate Sodium 35 MG TBEC Take by mouth. (Patient not taking: Reported on 03/18/2020)    Facility-Administered Encounter Medications as of 03/18/2020  Medication  . 0.9 %  sodium chloride infusion     ROS: Gen: no fever, chills  Skin: no rash, itching ENT:  no ear pain, ear drainage, nasal congestion, rhinorrhea, sinus pressure, sore throat Eyes: no blurry vision, double vision Resp: no cough, wheeze,SOB Breast: no breast tenderness, no nipple discharge, no breast masses CV: no CP, palpitations, LE edema,  GI: no heartburn, n/v/d/c, abd pain GU: no dysuria, urgency, frequency, hematuria MSK: no joint pain, myalgias; + intermittent back pain Neuro: no dizziness, headache, weakness, vertigo Psych: no depression, anxiety, insomnia   Allergies  Allergen Reactions  .  Amoxicillin Hives  . Aspirin Hives  . Erythromycin Hives and Rash  . Ibuprofen Hives  . Latex Rash  . Tetracyclines & Related Hives  . Triple Antibiotic [Bacitracin-Neomycin-Polymyxin] Rash  . Codeine     Codeine cough syrup cause a rash and hives  . Flonase [Fluticasone]     Flonase causes hives,rash  . Gabapentin Swelling    ankles swelling.     BP 124/76   Pulse 74   Temp 98.5 F (36.9 C) (Temporal)   Ht 5\' 9"  (1.753 m)   Wt 226 lb 9.6 oz (102.8 kg)   SpO2 96%   BMI 33.46 kg/m    BP Readings from Last 3 Encounters:  03/18/20 124/76  03/05/19 120/80  09/12/18 136/80   Pulse Readings from Last 3 Encounters:  03/18/20 74  03/05/19 80  09/12/18 79   Wt Readings from Last 3 Encounters:  03/18/20 226 lb 9.6 oz (102.8 kg)  03/05/19 225 lb 9.6 oz (102.3 kg)  09/12/18 219 lb (99.3 kg)    Physical Exam Constitutional:      General: She is not in acute distress.    Appearance: She is well-developed and well-nourished.  HENT:     Head: Normocephalic and atraumatic.     Right Ear: Tympanic membrane and ear canal normal.     Left Ear: Tympanic membrane and ear canal normal.     Nose: Nose normal.     Mouth/Throat:     Mouth: Oropharynx is clear and moist and mucous membranes are normal.  Eyes:     Conjunctiva/sclera: Conjunctivae normal.  Neck:     Thyroid: No thyromegaly.  Cardiovascular:     Rate and Rhythm: Normal rate and regular rhythm.      Pulses: Intact distal pulses.     Heart sounds: Normal heart sounds. No murmur heard.   Pulmonary:     Effort: Pulmonary effort is normal. No respiratory distress.     Breath sounds: Normal breath sounds. No wheezing or rhonchi.  Abdominal:     General: Bowel sounds are normal. There is no distension.     Palpations: Abdomen is soft. There is no mass.     Tenderness: There is no abdominal tenderness.  Musculoskeletal:        General: No edema.     Cervical back: Neck supple.     Right lower leg: No edema.     Left lower leg: No edema.  Lymphadenopathy:     Cervical: No cervical adenopathy.  Skin:    General: Skin is warm and dry.  Neurological:     Mental Status: She is alert and oriented to person, place, and time.     Motor: No abnormal muscle tone.     Coordination: Coordination normal.  Psychiatric:        Mood and Affect: Mood and affect normal.        Behavior: Behavior normal.     A/P:  1. Annual physical exam - discussed importance of regular CV exercise, healthy diet, adequate sleep - UTD on dental and vision exams - UTD on mammo and colonoscopy; due for dexa but pt declines and would like to do next year - immunizations UTD - ALT; Future - AST; Future - Basic metabolic panel; Future - CBC; Future - next CPE in 1 year  2. Vitamin D deficiency - takes calcium and Vit D supplement - VITAMIN D 25 Hydroxy (Vit-D Deficiency, Fractures); Future  3. Hyperlipidemia, mixed - not on medication -  ALT; Future - AST; Future - Lipid panel; Future    This visit occurred during the SARS-CoV-2 public health emergency.  Safety protocols were in place, including screening questions prior to the visit, additional usage of staff PPE, and extensive cleaning of exam room while observing appropriate contact time as indicated for disinfecting solutions.

## 2020-03-19 ENCOUNTER — Encounter: Payer: No Typology Code available for payment source | Admitting: Family Medicine

## 2020-03-19 ENCOUNTER — Other Ambulatory Visit: Payer: Self-pay

## 2020-03-19 ENCOUNTER — Other Ambulatory Visit (INDEPENDENT_AMBULATORY_CARE_PROVIDER_SITE_OTHER): Payer: No Typology Code available for payment source

## 2020-03-19 DIAGNOSIS — E782 Mixed hyperlipidemia: Secondary | ICD-10-CM

## 2020-03-19 DIAGNOSIS — Z0000 Encounter for general adult medical examination without abnormal findings: Secondary | ICD-10-CM | POA: Diagnosis not present

## 2020-03-19 DIAGNOSIS — E559 Vitamin D deficiency, unspecified: Secondary | ICD-10-CM

## 2020-03-19 NOTE — Addendum Note (Signed)
Addended by: Lynnea Ferrier on: 03/19/2020 09:45 AM   Modules accepted: Orders

## 2020-03-20 LAB — BASIC METABOLIC PANEL
BUN: 11 mg/dL (ref 7–25)
CO2: 26 mmol/L (ref 20–32)
Calcium: 9.7 mg/dL (ref 8.6–10.4)
Chloride: 104 mmol/L (ref 98–110)
Creat: 0.83 mg/dL (ref 0.60–0.93)
Glucose, Bld: 91 mg/dL (ref 65–99)
Potassium: 4.1 mmol/L (ref 3.5–5.3)
Sodium: 140 mmol/L (ref 135–146)

## 2020-03-20 LAB — CBC
HCT: 41.6 % (ref 35.0–45.0)
Hemoglobin: 14.1 g/dL (ref 11.7–15.5)
MCH: 31.7 pg (ref 27.0–33.0)
MCHC: 33.9 g/dL (ref 32.0–36.0)
MCV: 93.5 fL (ref 80.0–100.0)
MPV: 10.1 fL (ref 7.5–12.5)
Platelets: 322 10*3/uL (ref 140–400)
RBC: 4.45 10*6/uL (ref 3.80–5.10)
RDW: 12.2 % (ref 11.0–15.0)
WBC: 5.7 10*3/uL (ref 3.8–10.8)

## 2020-03-20 LAB — LIPID PANEL
Cholesterol: 216 mg/dL — ABNORMAL HIGH (ref <200)
HDL: 60 mg/dL (ref >=50)
LDL Cholesterol (Calc): 129 mg/dL (calc) — ABNORMAL HIGH
Non-HDL Cholesterol (Calc): 156 mg/dL (calc) — ABNORMAL HIGH (ref <130)
Total CHOL/HDL Ratio: 3.6 (calc) (ref <5.0)
Triglycerides: 159 mg/dL — ABNORMAL HIGH (ref <150)

## 2020-03-20 LAB — VITAMIN D 25 HYDROXY (VIT D DEFICIENCY, FRACTURES): Vit D, 25-Hydroxy: 29 ng/mL — ABNORMAL LOW (ref 30–100)

## 2020-03-20 LAB — ALT: ALT: 48 U/L — ABNORMAL HIGH (ref 6–29)

## 2020-03-20 LAB — AST: AST: 36 U/L — ABNORMAL HIGH (ref 10–35)

## 2020-03-23 ENCOUNTER — Encounter: Payer: Self-pay | Admitting: Family Medicine

## 2020-03-24 ENCOUNTER — Telehealth: Payer: Self-pay

## 2020-03-24 NOTE — Telephone Encounter (Signed)
PA for [place] was submitted and approved by [facility] from 03/19/20- 03/20/2023. Dm/cma

## 2020-06-15 ENCOUNTER — Ambulatory Visit: Payer: No Typology Code available for payment source | Admitting: Family Medicine

## 2020-06-22 ENCOUNTER — Ambulatory Visit (INDEPENDENT_AMBULATORY_CARE_PROVIDER_SITE_OTHER): Payer: No Typology Code available for payment source | Admitting: Family Medicine

## 2020-06-22 ENCOUNTER — Ambulatory Visit: Payer: Self-pay

## 2020-06-22 ENCOUNTER — Other Ambulatory Visit: Payer: Self-pay

## 2020-06-22 VITALS — BP 134/76 | Ht 69.5 in | Wt 215.0 lb

## 2020-06-22 DIAGNOSIS — M778 Other enthesopathies, not elsewhere classified: Secondary | ICD-10-CM | POA: Diagnosis not present

## 2020-06-22 DIAGNOSIS — M25511 Pain in right shoulder: Secondary | ICD-10-CM

## 2020-06-22 NOTE — Assessment & Plan Note (Signed)
Symptoms seem more associated with the capsule as opposed to the rotator cuff with a bursitis.  She has stopped her repetitive subregion back in her backseat.  And her symptoms have improved. -Counseled on home exercise therapy and supportive care. -Counseled on vulgarities. -Could consider injection or physical therapy.

## 2020-06-22 NOTE — Patient Instructions (Signed)
Good to see you Please try the exercises  Please try heat before exercises and ice after   Please send me a message in MyChart with any questions or updates.  Please see me back in 4 weeks.   --Dr. Raeford Razor

## 2020-06-22 NOTE — Progress Notes (Signed)
Denise Haas - 75 y.o. female MRN [id number]  Date of birth: 1945-12-12  SUBJECTIVE:  Including CC & ROS.  No chief complaint on file.   Denise Haas is a 75 y.o. female that is presenting with acute right shoulder pain.  The pain has been occurring in the lateral aspect.  She was noticing it more when she is reaching the back of her seat.  Mild and intermittent in nature.  Denies any history of surgery.   Review of Systems See HPI   HISTORY: Past Medical, Surgical, Social, and Family History Reviewed & Updated per EMR.   Pertinent Historical Findings include:  Past Medical History:  Diagnosis Date  . Abnormal liver function test 02/20/2015  . Acne   . Acute bronchitis 02/20/2015  . Allergy    multiple drug allergies  . Anxiety   . Arthritis   . Cataract   . Diarrhea 03/29/2018   with gas for over a month  . GERD (gastroesophageal reflux disease)    no meds  . Glaucoma   . H/O measles   . H/O mumps   . History of chicken pox   . History of colonic polyps   . History of colonic polyps 02/21/2016  . Neuromuscular disorder ([place])    09/12/18 pt. denies  . Osteopenia   . Seborrhea capitis 08/28/2016    Past Surgical History:  Procedure Laterality Date  . [place]   cage placed in lumbar  . BREAST SURGERY  1980's   benign left breast biopsy for cyst  . COLONOSCOPY    . EYE SURGERY     cataracts b/l    Family History  Problem Relation Age of Onset  . Cancer Mother        breast  . Heart disease Mother   . Diabetes Mother   . Asthma Mother   . Hyperlipidemia Mother   . Peripheral vascular disease Mother   . COPD Father   . Heart disease Father   . Arthritis Father   . Stroke Maternal Grandmother   . Heart disease Maternal Grandmother   . Heart disease Maternal Grandfather   . Heart attack Maternal Grandfather   . Cancer Paternal Grandfather        throat  . Glaucoma Sister   . Glaucoma Brother   . Heart disease Brother   . Alcohol abuse  Brother   . Depression Brother   . Hyperlipidemia Brother   . COPD Daughter   . Glaucoma Brother   . Cancer Brother        skin cancer, lost tip of thumb  . Glaucoma Brother   . Crohn's disease Brother        colostomy in place  . Cancer Cousin        colon   . Colon cancer Cousin   . Esophageal cancer Neg Hx   . Rectal cancer Neg Hx   . Stomach cancer Neg Hx     Social History   Socioeconomic History  . Marital status: Married    Spouse name: Not on file  . Number of children: 2  . Years of education: Not on file  . Highest education level: Not on file  Occupational History  . Not on file  Tobacco Use  . Smoking status: Never Smoker  . Smokeless tobacco: Never Used  Vaping Use  . Vaping Use: Never used  Substance and Sexual Activity  . Alcohol use: Yes    Alcohol/week:  14.0 standard drinks    Types: 14 Glasses of wine per week  . Drug use: No  . 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 no dietary restrictions. wears seat belt  Other Topics Concern  . Not on file  Social History Narrative  . Not on file   Social Determinants of Health   Financial Resource Strain: Not on file  Food Insecurity: Not on file  Transportation Needs: Not on file  Physical Activity: Not on file  Stress: Not on file  Social Connections: Not on file  Intimate Partner Violence: Not on file     PHYSICAL EXAM:  VS: BP 134/76   Ht 5' 9.5" (1.765 m)   Wt 215 lb (97.5 kg)   BMI 31.29 kg/m  Physical Exam Gen: NAD, alert, cooperative with exam, well-appearing MSK:  Right shoulder: Normal flexion. Some limitation with external rotation compared to the contralateral side. Normal empty can test. Normal speeds test. Neurovascular intact  Limited ultrasound: Right shoulder:  No changes of the biceps tendon. Normal-appearing subscapularis. Mild overlying subacromial bursitis and mild degenerative changes of the supraspinatus. Trace effusion  within the posterior glenohumeral joint.  Summary: Mild degenerative changes of the supraspinatus and a bursitis.  Ultrasound and interpretation by Clearance Coots, MD    ASSESSMENT & PLAN:   Capsulitis of right shoulder Symptoms seem more associated with the capsule as opposed to the rotator cuff with a bursitis.  She has stopped her repetitive subregion back in her backseat.  And her symptoms have improved. -Counseled on home exercise therapy and supportive care. -Counseled on vulgarities. -Could consider injection or physical therapy.

## 2020-09-06 ENCOUNTER — Ambulatory Visit (INDEPENDENT_AMBULATORY_CARE_PROVIDER_SITE_OTHER): Payer: No Typology Code available for payment source | Admitting: Ophthalmology

## 2020-09-06 ENCOUNTER — Encounter (INDEPENDENT_AMBULATORY_CARE_PROVIDER_SITE_OTHER): Payer: Self-pay | Admitting: Ophthalmology

## 2020-09-06 ENCOUNTER — Other Ambulatory Visit: Payer: Self-pay

## 2020-09-06 DIAGNOSIS — H35372 Puckering of macula, left eye: Secondary | ICD-10-CM

## 2020-09-06 DIAGNOSIS — G43109 Migraine with aura, not intractable, without status migrainosus: Secondary | ICD-10-CM | POA: Diagnosis not present

## 2020-09-06 DIAGNOSIS — H43811 Vitreous degeneration, right eye: Secondary | ICD-10-CM

## 2020-09-06 NOTE — Assessment & Plan Note (Signed)

## 2020-09-06 NOTE — Assessment & Plan Note (Signed)

## 2020-09-06 NOTE — Progress Notes (Signed)
09/06/2020     CHIEF COMPLAINT Patient presents for Eye Problem (OD VA changes//Pt sts yesterday she saw a "shade", specifically a white shade, over OD suddenly, "seeing nothing but my chin" lasting 1-2 minutes. Pt sts after 1-2 min approx, VA OD returned to normal. Pt denies symptoms OS. Pt denies ocular pain, but reports dull HA at forehead this AM. Pt denies flashes of light. Pt sts she was not lifting anything heavy or exerting herself when symptoms started yesterday. Pt sts VA OD is now "normal.")   HISTORY OF PRESENT ILLNESS: Denise Haas is a 75 y.o. female who presents to the clinic today for:   HPI     Eye Problem           Comments: OD VA changes  Pt sts yesterday she saw a "shade", specifically a white shade, over OD suddenly, "seeing nothing but my chin" lasting 1-2 minutes. Pt sts after 1-2 min approx, VA OD returned to normal. Pt denies symptoms OS. Pt denies ocular pain, but reports dull HA at forehead this AM. Pt denies flashes of light. Pt sts she was not lifting anything heavy or exerting herself when symptoms started yesterday. Pt sts VA OD is now "normal."         Comments   Incidentally patient does have apparent not associated leg cramps, nightly once every 1 to 2 weeks.      Last edited by Hurman Horn, MD on 09/06/2020  1:51 PM.      Referring physician: Ronnald Nian, DO [address] [place],  [place] [address]  HISTORICAL INFORMATION:   Selected notes from the MEDICAL RECORD NUMBER       CURRENT MEDICATIONS: Current Outpatient Medications (Ophthalmic Drugs)  Medication Sig   latanoprost (XALATAN) 0.005 % ophthalmic solution Place 1 drop into both eyes.    Polyvinyl Alcohol-Povidone (REFRESH OP) Apply to eye. Apply one drop each eye prn dry eyes   No current facility-administered medications for this visit. (Ophthalmic Drugs)   Current Outpatient Medications (Other)  Medication Sig   Biotin 300 MCG TABS Take by mouth.    Calcium Carb-Cholecalciferol (CALCIUM + D3) 600-200 MG-UNIT TABS Take 1 tablet by mouth 2 (two) times daily.   clobetasol (OLUX) 0.05 % topical foam    Melatonin 2.5 MG CAPS Take 1 capsule by mouth Nightly.   nystatin-triamcinolone ointment (MYCOLOG) nystatin-triamcinolone 100,000 unit/gram-0.1 % topical ointment  APPLY TO THE AFFECTED AREA(S) BY TOPICAL ROUTE 2 TIMES PER DAY   OVER THE COUNTER MEDICATION Hylaurionic Acid-Take 2 capsules a day For arthritis   tretinoin (RETIN-A) 0.025 % cream Apply topically at bedtime.   Current Facility-Administered Medications (Other)  Medication Route   0.9 %  sodium chloride infusion Intravenous      REVIEW OF SYSTEMS:    ALLERGIES Allergies  Allergen Reactions   Amoxicillin Hives   Aspirin Hives   Erythromycin Hives and Rash   Ibuprofen Hives   Latex Rash   Tetracyclines & Related Hives   Triple Antibiotic [Bacitracin-Neomycin-Polymyxin] Rash   Codeine     Codeine cough syrup cause a rash and hives   Flonase [Fluticasone]     Flonase causes hives,rash   Gabapentin Swelling    ankles swelling.     PAST MEDICAL HISTORY Past Medical History:  Diagnosis Date   Abnormal liver function test 02/20/2015   Acne    Acute bronchitis 02/20/2015   Allergy    multiple drug allergies   Anxiety  Arthritis    Cataract    Diarrhea 03/29/2018   with gas for over a month   GERD (gastroesophageal reflux disease)    no meds   Glaucoma    H/O measles    H/O mumps    History of chicken pox    History of colonic polyps    History of colonic polyps 02/21/2016   Neuromuscular disorder ([place])    09/12/18 pt. denies   Osteopenia    Seborrhea capitis 08/28/2016   Past Surgical History:  Procedure Laterality Date   [place]   cage placed in lumbar   BREAST SURGERY  1980's   benign left breast biopsy for cyst   COLONOSCOPY     EYE SURGERY     cataracts b/l    FAMILY HISTORY Family History  Problem Relation Age of Onset    Cancer Mother        breast   Heart disease Mother    Diabetes Mother    Asthma Mother    Hyperlipidemia Mother    Peripheral vascular disease Mother    COPD Father    Heart disease Father    Arthritis Father    Stroke Maternal Grandmother    Heart disease Maternal Grandmother    Heart disease Maternal Grandfather    Heart attack Maternal Grandfather    Cancer Paternal Grandfather        throat   Glaucoma Sister    Glaucoma Brother    Heart disease Brother    Alcohol abuse Brother    Depression Brother    Hyperlipidemia Brother    COPD Daughter    Glaucoma Brother    Cancer Brother        skin cancer, lost tip of thumb   Glaucoma Brother    Crohn's disease Brother        colostomy in place   Cancer Cousin        colon    Colon cancer Cousin    Esophageal cancer Neg Hx    Rectal cancer Neg Hx    Stomach cancer Neg Hx     SOCIAL HISTORY Social History   Tobacco Use   Smoking status: Never   Smokeless tobacco: Never  Vaping Use   Vaping Use: Never used  Substance Use Topics   Alcohol use: Yes    Alcohol/week: 14.0 standard drinks    Types: 14 Glasses of wine per week   Drug use: No         OPHTHALMIC EXAM:  Base Eye Exam     Visual Acuity (ETDRS)       Right Left   Dist [place] 20/25 20/25         Tonometry (Tonopen, 1:22 PM)       Right Left   Pressure 20 18         Pupils       Pupils Dark Light Shape React APD   Right PERRL 4 3 Round Brisk None   Left PERRL 4 3 Round Brisk None         Visual Fields (Counting fingers)       Left Right    Full Full         Extraocular Movement       Right Left    Full Full         Neuro/Psych     Oriented x3: Yes   Mood/Affect: Normal         Dilation  Right eye: 1.0% Mydriacyl, 2.5% Phenylephrine @ 1:23 PM           Slit Lamp and Fundus Exam     External Exam       Right Left   External Normal Normal         Slit Lamp Exam       Right Left   Lids/Lashes  Normal Normal   Conjunctiva/Sclera White and quiet White and quiet   Cornea Clear Clear   Anterior Chamber Deep and quiet Deep and quiet   Iris Round and reactive Round and reactive   Lens Posterior chamber intraocular lens Posterior chamber intraocular lens   Anterior Vitreous Normal Normal         Fundus Exam       Right Left   Posterior Vitreous Posterior vitreous detachment    Disc Normal    C/D Ratio 0.45    Macula Normal    Vessels Normal, no abnormal vasculature, no plaques    Periphery Normal             IMAGING AND PROCEDURES  Imaging and Procedures for 09/06/20  OCT, Retina - OU - Both Eyes       Right Eye Quality was good. Scan locations included subfoveal. Central Foveal Thickness: 299. Findings include normal observations.   Left Eye Quality was good. Scan locations included subfoveal. Central Foveal Thickness: 350. Progression has been stable. Findings include macular pucker, abnormal foveal contour.   Notes No change over last 2 years             ASSESSMENT/PLAN:  Posterior vitreous detachment of right eye  The nature of posterior vitreous detachment was discussed with the patient as well as its physiology, its age prevalence, and its possible implication regarding retinal breaks and detachment.  An informational brochure was offered to the patient.  All the patient's questions were answered.  The patient was asked to return if new or different flashes or floaters develops.   Patient was instructed to contact office immediately if any new changes were noticed. I explained to the patient that vitreous inside the eye is similar to jello inside a bowl. As the jello melts it can start to pull away from the bowl, similarly the vitreous throughout our lives can begin to pull away from the retina. That process is called a posterior vitreous detachment. In some cases, the vitreous can tug hard enough on the retina to form a retinal tear. I discussed with  the patient the signs and symptoms of a retinal detachment.  Do not rub the eye.    Left epiretinal membrane The nature of macular pucker (epiretinal membrane ERM) was discussed with the patient as well as threshold criteria for vitrectomy surgery. I explained that in rare cases another surgery is needed to actually remove a second wrinkle should it regrow.  Most often, the epiretinal membrane and underlying wrinkled internal limiting membrane are removed with the first surgery, to accomplish the goals.   If the operative eye is Ph[place] (natural lens still present), cataract surgery is often recommended prior to Vitrectomy. This will enable the retina surgeon to have the best view during surgery and the patient to obtain optimal results in the future. Treatment options were discussed.  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  I explained to the patient that if this was primarily in the eye and a blood vessel was closing as a transient ischemic attack the vision would go dark or black and not "whiting out".  Patient has not had ever suffered ocular migraines in the past.  With the attendant nonassociated leg cramps, she could in fact have magnesium shortage which can small amounts can help with preventing of night leg cramps but more importantly stabilize vasculature potentially reactive vasculature which could lead to ocular migraines.  If ocular migraines continues she this is not an eye problem and say problem with the vision part of the brain.  She should seek evaluation with her primary care physician and go from there     ICD-10-CM   1. Posterior vitreous detachment of right eye  H43.811 OCT, Retina - OU - Both Eyes    2. Left epiretinal membrane  H35.372     3. 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  Anatomically OOD with no abnormalities.  Posterior vitreous detachment, present and no retinal vascular disease.  2.  Family migraine, which is CNS of origin with visual symptoms explained to the patient.  Brief event such as this while worrisome, do not speak any specific apologies unless they continue to increase in commonality and recurrences  I explained the patient that she should check a monocular fashion to see should this occur again if there are any symptoms at the same time and the other eye doing this by covering the affected eye and using the "better eye" to see if there is any similar symptoms in any part of the vision which would confirm again presence of ophthalmic migraine (CNS in origin)  3.  She can I attempt to use oral magnesium supplement 400mg  Liquigel once daily to decrease the occurrences of this but also it may coincidentally prevent nighttime leg cramps.   Follow-up with private personal care physician will be appropriate if symptoms increase in verity or and duration or recurrence Ophthalmic Meds Ordered this visit:  No orders of the defined types were placed in this encounter.      Return in about 1 year (around 09/06/2021) for As scheduled, DILATE OU, OCT.  There are no Patient Instructions on file for this visit.   Explained the diagnoses, plan, and follow up with the patient and they expressed understanding.  Patient expressed understanding of the importance of proper follow up care.   Denise Haas M.D. Diseases & Surgery of the Retina and Vitreous [facility] 09/06/20     Abbreviations: M myopia (nearsighted); A astigmatism; H hyperopia (farsighted); P presbyopia; Mrx spectacle prescription;  CTL contact lenses; OD right eye; OS left eye; OU both eyes  XT exotropia; ET esotropia; PEK punctate epithelial keratitis; PEE punctate epithelial erosions; DES dry eye syndrome; MGD meibomian gland dysfunction; ATs  artificial tears; PFAT's preservative free artificial tears; [place] nuclear sclerotic cataract; PSC posterior subcapsular cataract; ERM epi-retinal membrane; PVD posterior vitreous detachment; RD retinal detachment; DM diabetes mellitus; DR diabetic retinopathy; NPDR non-proliferative diabetic retinopathy; PDR proliferative diabetic retinopathy; CSME clinically significant macular edema; DME diabetic macular edema; dbh dot blot hemorrhages; CWS cotton wool spot; POAG primary open angle glaucoma; C/D cup-to-disc ratio; HVF humphrey visual field; GVF goldmann visual field; OCT optical coherence tomography; IOP intraocular pressure; BRVO Branch retinal vein occlusion; CRVO central retinal vein occlusion; CRAO central retinal artery occlusion;  BRAO branch retinal artery occlusion; RT retinal tear; SB scleral buckle; PPV pars plana vitrectomy; VH Vitreous hemorrhage; PRP panretinal laser photocoagulation; IVK intravitreal kenalog; VMT vitreomacular traction; MH Macular hole;  NVD neovascularization of the disc; NVE neovascularization elsewhere; AREDS age related eye disease study; ARMD age related macular degeneration; POAG primary open angle glaucoma; EBMD epithelial/anterior basement membrane dystrophy; ACIOL anterior chamber intraocular lens; IOL intraocular lens; PCIOL posterior chamber intraocular lens; Phaco/IOL phacoemulsification with intraocular lens placement; [place] photorefractive keratectomy; LASIK laser assisted in situ keratomileusis; HTN hypertension; DM diabetes mellitus; COPD chronic obstructive pulmonary disease

## 2020-09-06 NOTE — Assessment & Plan Note (Signed)
Symptomatic OD with with central and paracentral loss of vision" whiting out the vision", the whiting out of vision lasting 1 to 2 minutes but speaks to the fact this is most likely CNS in origin and not ocular in origin.  I explained to the patient that if this was primarily in the eye and a blood vessel was closing as a transient ischemic attack the vision would go dark or black and not "whiting out".  Patient has not had ever suffered ocular migraines in the past.  With the attendant nonassociated leg cramps, she could in fact have magnesium shortage which can small amounts can help with preventing of night leg cramps but more importantly stabilize vasculature potentially reactive vasculature which could lead to ocular migraines.  If ocular migraines continues she this is not an eye problem and say problem with the vision part of the brain.  She should seek evaluation with her primary care physician and go from there

## 2020-09-23 IMAGING — DX DG CHEST 2V
2 series · 2 of 2 positions shown · non-contrast
Comparison: 05/17/2009

CLINICAL DATA: Productive cough X 3 weeks yellow phlegm. Hx of
lumpectomy on left breast.

EXAM:
CHEST - 2 VIEW

[chest pa]
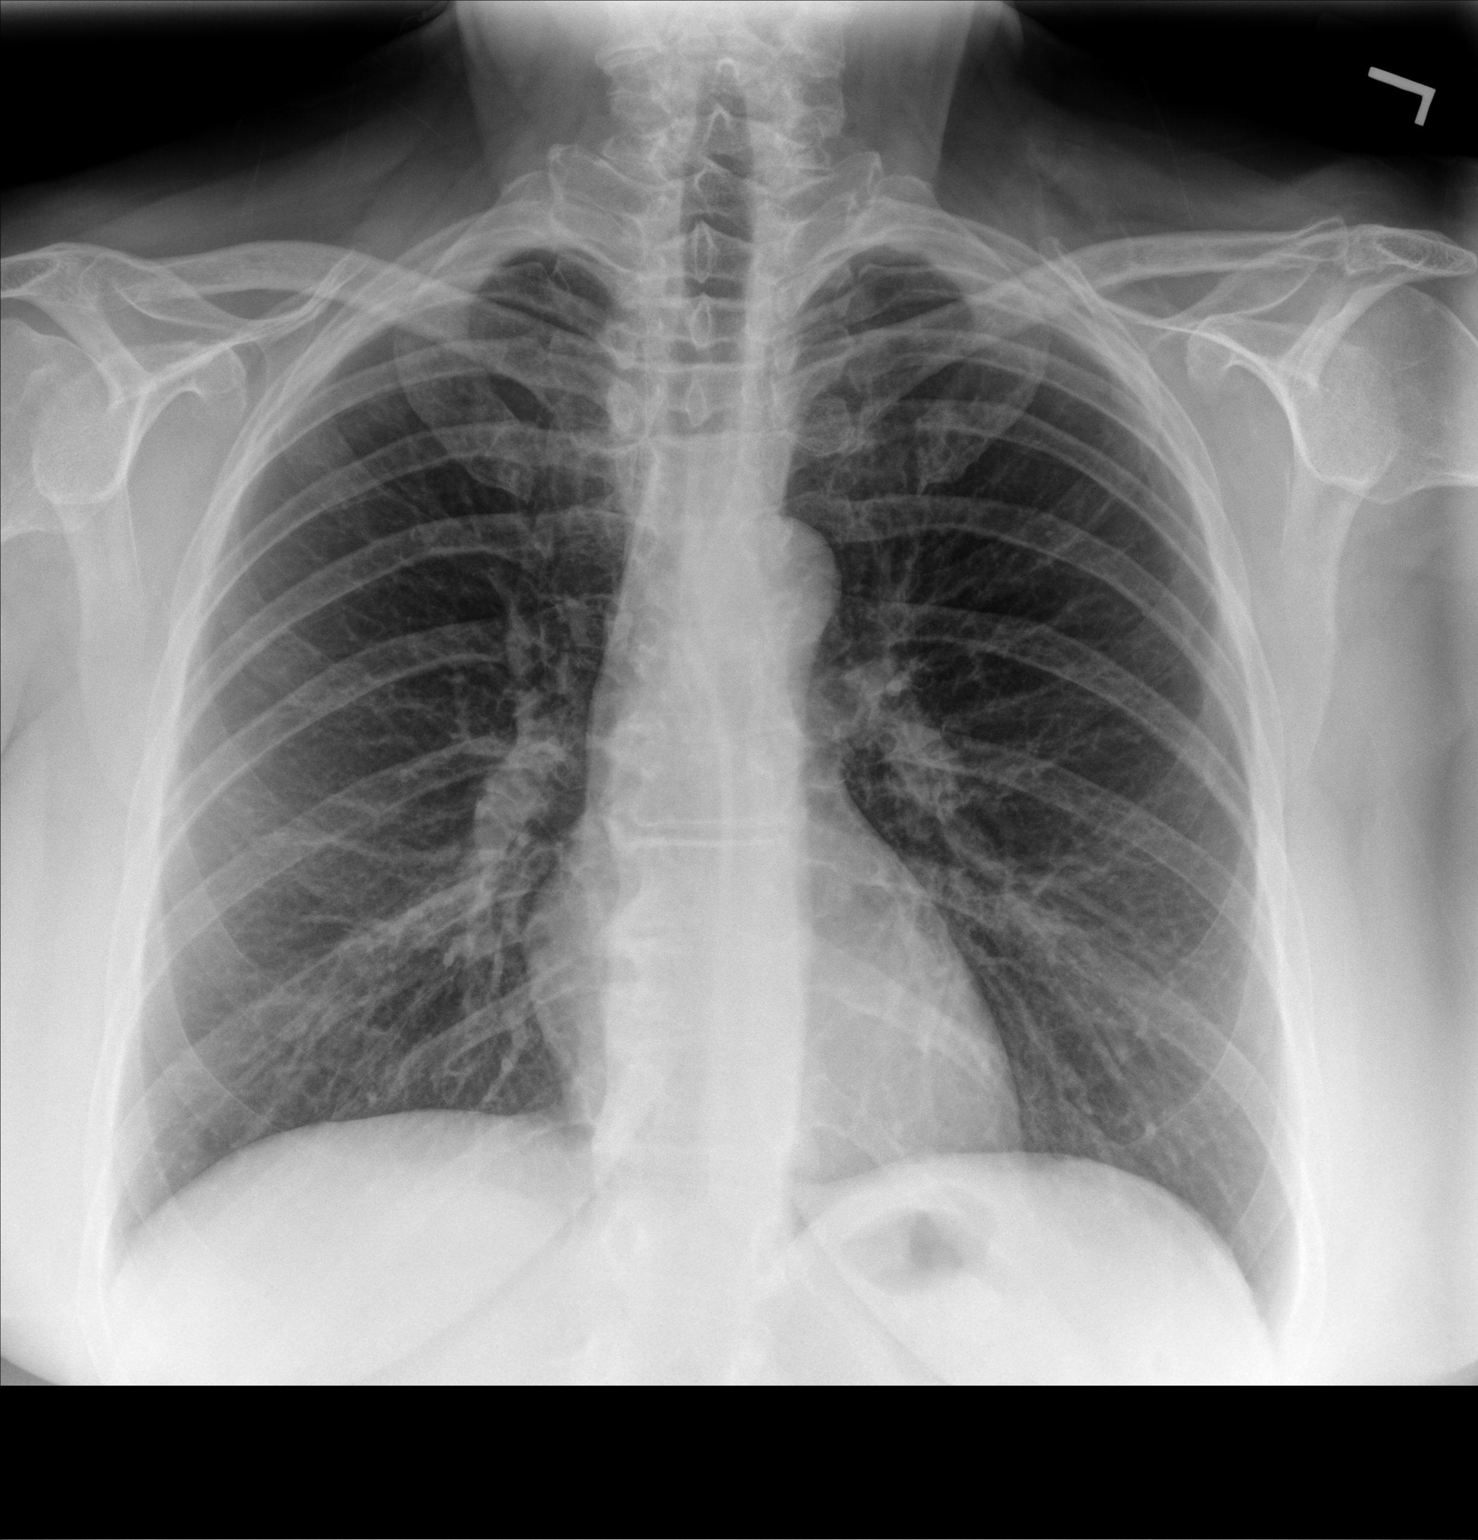

[chest lat]
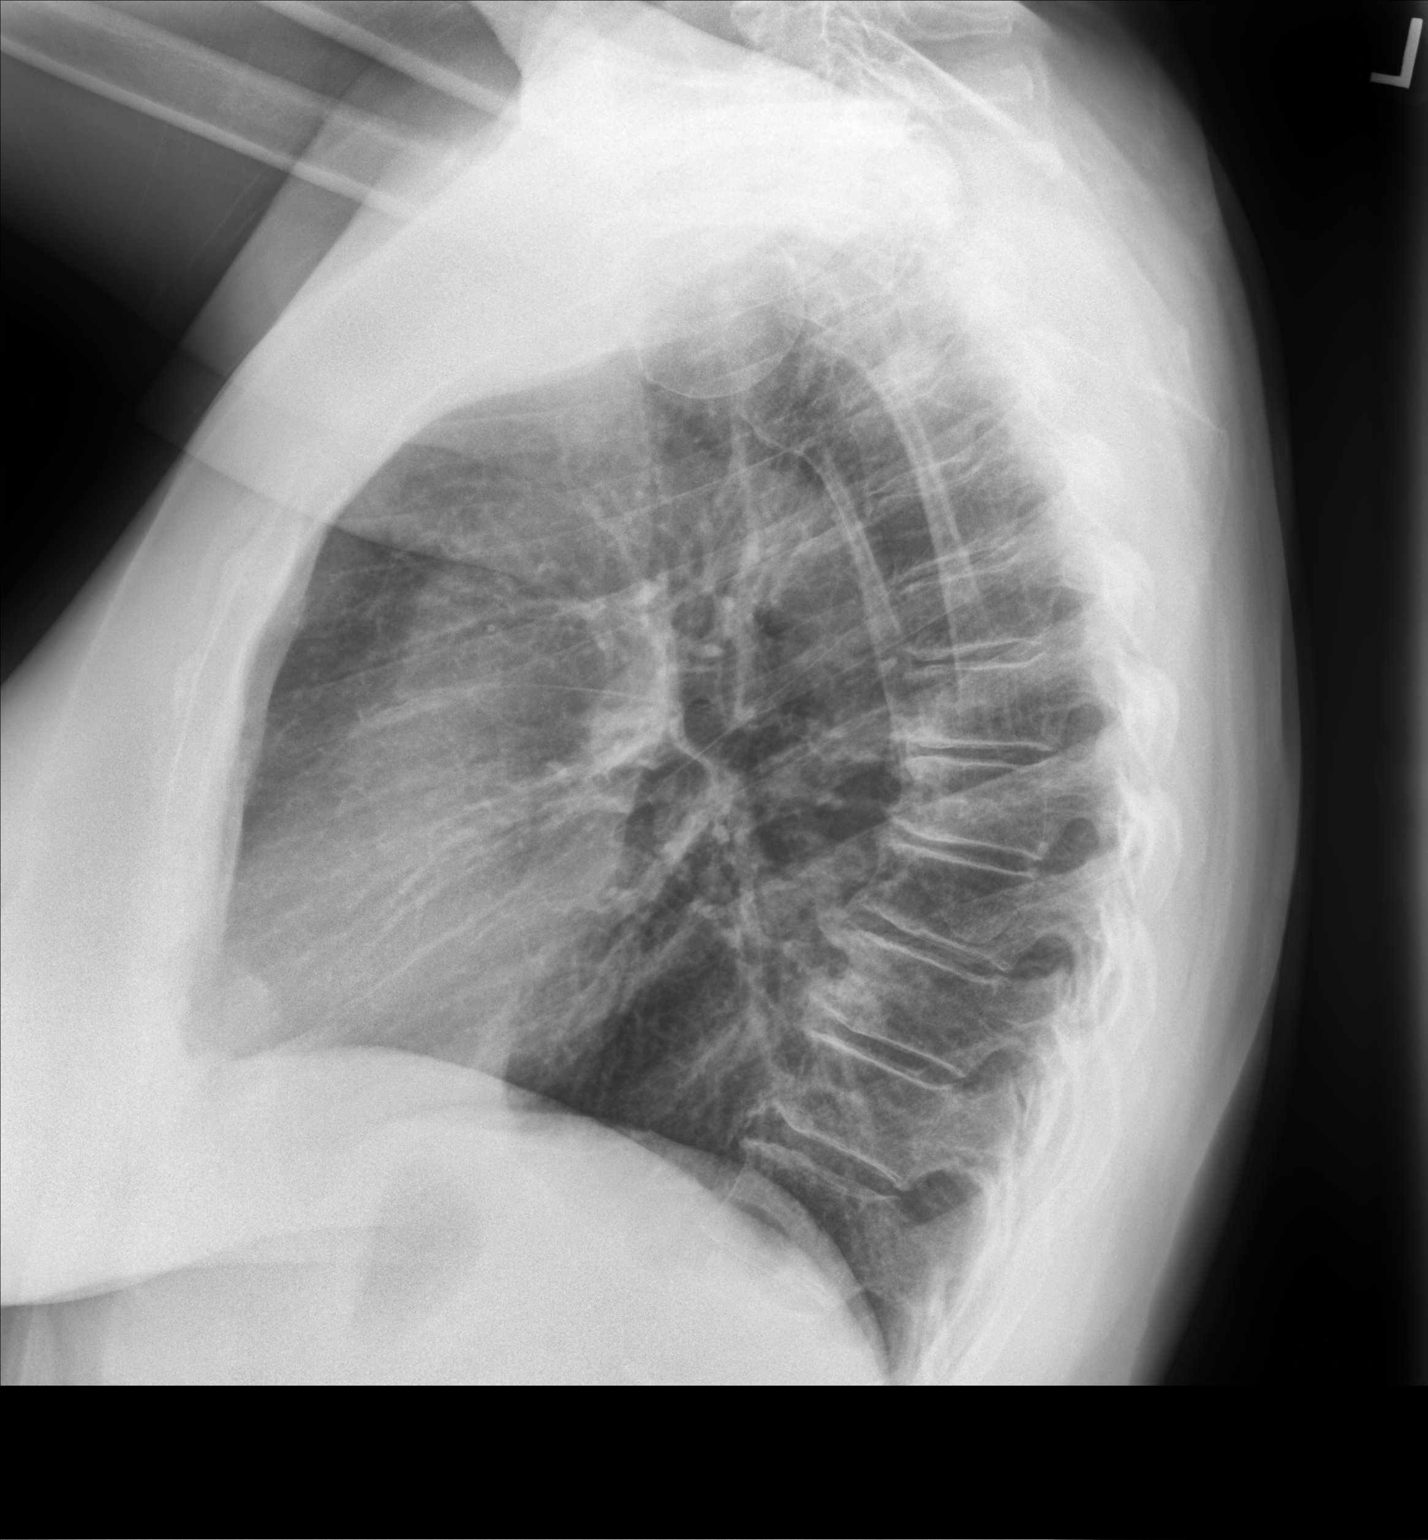

[2 of 2 positions shown; findings below may reference images not displayed]

FINDINGS: The heart size and mediastinal contours are within normal limits.
Both lungs are clear. No pleural effusion or pneumothorax. The
visualized skeletal structures are unremarkable.
IMPRESSION: No active cardiopulmonary disease.

## 2021-02-15 ENCOUNTER — Other Ambulatory Visit: Payer: Self-pay

## 2021-02-15 ENCOUNTER — Ambulatory Visit: Payer: No Typology Code available for payment source | Admitting: Nurse Practitioner

## 2021-02-15 VITALS — BP 126/72 | HR 86 | Temp 98.3°F | Resp 12 | Ht 69.5 in | Wt 220.2 lb

## 2021-02-15 DIAGNOSIS — R21 Rash and other nonspecific skin eruption: Secondary | ICD-10-CM | POA: Diagnosis not present

## 2021-02-15 MED ORDER — HYDROCORTISONE 2.5 % EX CREA: TOPICAL_CREAM | Freq: Two times a day (BID) | CUTANEOUS | 0 refills | Status: DC

## 2021-02-15 NOTE — Assessment & Plan Note (Signed)
Patient comes in with a singular lesion to the right lateral lower abdomen.  Patient has been under a lot of stress as of late. patient was concern for shingles.  Given presentation exam do not think it is shingles.  Did discuss signs and symptoms to look out for in case rash spreads starts getting vesicles or starts crusting over.  She can reach back out and let me know.  Patient has tried hydrocortisone 2.5% steroid cream and it has been beneficial we will send in prescription steroid precautions.  Patient can continue monitoring for any changes reach out or follow-up.

## 2021-02-15 NOTE — Progress Notes (Signed)
Acute Office Visit  Subjective:    Patient ID: Denise Haas, female    DOB: 05-Aug-1945, 75 y.o.   MRN: [id number]  Chief Complaint  Patient presents with   skin problem/bite    Noticed itching on the right side of abdomen on 02/13/21-there is a red bump/bite? Patient worried about shingles possibly. No burning or pain present. No fever.    HPI Patient is in today for Rash  Located on Right lower lateral abdomen First noticed it Sunday night Has been under extreme stress. States her husband has stage 3 cancer and lots of health issue. Has had shingles vacinne Never had shingles before Has tried hemmorrhoid cream  that did help with the itching. Has not spread, but gotten some better per patient report. She was concerned she had shingles  No pain , burning or tingling to rash   Past Medical History:  Diagnosis Date   Abnormal liver function test 02/20/2015   Acne    Acute bronchitis 02/20/2015   Allergy    multiple drug allergies   Anxiety    Arthritis    Cataract    Diarrhea 03/29/2018   with gas for over a month   GERD (gastroesophageal reflux disease)    no meds   Glaucoma    H/O measles    H/O mumps    History of chicken pox    History of colonic polyps    History of colonic polyps 02/21/2016   Neuromuscular disorder ([place])    09/12/18 pt. denies   Osteopenia    Seborrhea capitis 08/28/2016    Past Surgical History:  Procedure Laterality Date   [place]   cage placed in lumbar   BREAST SURGERY  1980's   benign left breast biopsy for cyst   COLONOSCOPY     EYE SURGERY     cataracts b/l    Family History  Problem Relation Age of Onset   Cancer Mother        breast   Heart disease Mother    Diabetes Mother    Asthma Mother    Hyperlipidemia Mother    Peripheral vascular disease Mother    COPD Father    Heart disease Father    Arthritis Father    Stroke Maternal Grandmother    Heart disease Maternal Grandmother    Heart disease  Maternal Grandfather    Heart attack Maternal Grandfather    Cancer Paternal Grandfather        throat   Glaucoma Sister    Glaucoma Brother    Heart disease Brother    Alcohol abuse Brother    Depression Brother    Hyperlipidemia Brother    COPD Daughter    Glaucoma Brother    Cancer Brother        skin cancer, lost tip of thumb   Glaucoma Brother    Crohn's disease Brother        colostomy in place   Cancer Cousin        colon    Colon cancer Cousin    Esophageal cancer Neg Hx    Rectal cancer Neg Hx    Stomach cancer Neg Hx     Social History   Socioeconomic History   Marital status: Married    Spouse name: Not on file   Number of children: 2   Years of education: Not on file   Highest education level: Not on file  Occupational History   Not  on file  Tobacco Use   Smoking status: Never   Smokeless tobacco: Never  Vaping Use   Vaping Use: Never used  Substance and Sexual Activity   Alcohol use: Yes    Alcohol/week: 14.0 standard drinks    Types: 14 Glasses of wine per week   Drug use: No   Sexual activity: Never    Comment: 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 no dietary restrictions. wears seat belt  Other Topics Concern   Not on file  Social History Narrative   Not on file   Social Determinants of Health   Financial Resource Strain: Not on file  Food Insecurity: Not on file  Transportation Needs: Not on file  Physical Activity: Not on file  Stress: Not on file  Social Connections: Not on file  Intimate Partner Violence: Not on file    Outpatient Medications Prior to Visit  Medication Sig Dispense Refill   Biotin 300 MCG TABS Take by mouth.     Calcium Carb-Cholecalciferol (CALCIUM + D3) 600-200 MG-UNIT TABS Take 1 tablet by mouth 2 (two) times daily.     clobetasol (OLUX) 0.05 % topical foam      estradiol (ESTRACE) 0.1 MG/GM vaginal cream estradiol 0.01% (0.1 mg/gram) vaginal cream  Insert 1 g twice a week by vaginal route.      latanoprost (XALATAN) 0.005 % ophthalmic solution Place 1 drop into both eyes.      Melatonin 2.5 MG CAPS Take 1 capsule by mouth Nightly.     nystatin-triamcinolone ointment (MYCOLOG) nystatin-triamcinolone 100,000 unit/gram-0.1 % topical ointment  APPLY TO THE AFFECTED AREA(S) BY TOPICAL ROUTE 2 TIMES PER DAY     OVER THE COUNTER MEDICATION Hylaurionic Acid-Take 2 capsules a day For arthritis     Polyvinyl Alcohol-Povidone (REFRESH OP) Apply to eye. Apply one drop each eye prn dry eyes     Pramoxine-HC (HYDROCORTISONE ACE-PRAMOXINE) 2.5-1 % CREA hydrocortisone-pramoxine 2.5 %-1 % rectal cream     tretinoin (RETIN-A) 0.025 % cream Apply topically at bedtime. 135 g 3   Facility-Administered Medications Prior to Visit  Medication Dose Route Frequency Provider Last Rate Last Admin   0.9 %  sodium chloride infusion  500 mL Intravenous Once Jackquline Denmark, MD        Allergies  Allergen Reactions   Amoxicillin Hives   Aspirin Hives   Erythromycin Hives and Rash   Ibuprofen Hives   Latex Rash   Tetracyclines & Related Hives   Triple Antibiotic [Bacitracin-Neomycin-Polymyxin] Rash   Codeine     Codeine cough syrup cause a rash and hives   Flonase [Fluticasone]     Flonase causes hives,rash   Gabapentin Swelling    ankles swelling.     Review of Systems  Constitutional:  Negative for chills and fever.  Respiratory:  Negative for cough and shortness of breath.   Cardiovascular:  Negative for chest pain.  Gastrointestinal:  Negative for nausea and vomiting.  Skin:  Positive for rash.       Positive for itching   Neurological:  Negative for numbness.      Objective:    Physical Exam Vitals and nursing note reviewed.  Constitutional:      Appearance: Normal appearance.  Cardiovascular:     Rate and Rhythm: Normal rate and regular rhythm.  Pulmonary:     Effort: Pulmonary effort is normal.     Breath sounds: Normal breath sounds.  Skin:    Findings: Erythema and rash present.  Rash is papular. Rash  is not crusting or vesicular.     Comments: Itches per patient report. Raised red patch that itches. No vesicles appreciated  Neurological:     Mental Status: She is alert.  Psychiatric:        Mood and Affect: Mood normal.        Behavior: Behavior normal.        Thought Content: Thought content normal.        Judgment: Judgment normal.      BP 126/72   Pulse 86   Temp 98.3 F (36.8 C)   Resp 12   Ht 5' 9.5" (1.765 m)   Wt 220 lb 4 oz (99.9 kg)   SpO2 94%   BMI 32.06 kg/m  Wt Readings from Last 3 Encounters:  02/15/21 220 lb 4 oz (99.9 kg)  06/22/20 215 lb (97.5 kg)  03/18/20 226 lb 9.6 oz (102.8 kg)    Health Maintenance Due  Topic Date Due   COVID-19 Vaccine (4 - Booster for [place] series) 02/10/2020   MAMMOGRAM  04/26/2020    There are no preventive care reminders to display for this patient.   Lab Results  Component Value Date   TSH 1.53 03/05/2019   Lab Results  Component Value Date   WBC 5.7 03/19/2020   HGB 14.1 03/19/2020   HCT 41.6 03/19/2020   MCV 93.5 03/19/2020   PLT 322 03/19/2020   Lab Results  Component Value Date   NA 140 03/19/2020   K 4.1 03/19/2020   CO2 26 03/19/2020   GLUCOSE 91 03/19/2020   BUN 11 03/19/2020   CREATININE 0.83 03/19/2020   BILITOT 0.7 03/05/2019   ALKPHOS 52 02/22/2016   AST 36 (H) 03/19/2020   ALT 48 (H) 03/19/2020   PROT 6.9 03/05/2019   ALBUMIN 3.9 02/16/2015   CALCIUM 9.7 03/19/2020   Lab Results  Component Value Date   CHOL 216 (H) 03/19/2020   Lab Results  Component Value Date   HDL 60 03/19/2020   Lab Results  Component Value Date   LDLCALC 129 (H) 03/19/2020   Lab Results  Component Value Date   TRIG 159 (H) 03/19/2020   Lab Results  Component Value Date   CHOLHDL 3.6 03/19/2020   No results found for: HGBA1C     Assessment & Plan:   Problem List Items Addressed This Visit       Musculoskeletal and Integument   Rash - Primary    Patient comes in with a  singular lesion to the right lateral lower abdomen.  Patient has been under a lot of stress as of late. patient was concern for shingles.  Given presentation exam do not think it is shingles.  Did discuss signs and symptoms to look out for in case rash spreads starts getting vesicles or starts crusting over.  She can reach back out and let me know.  Patient has tried hydrocortisone 2.5% steroid cream and it has been beneficial we will send in prescription steroid precautions.  Patient can continue monitoring for any changes reach out or follow-up.      Relevant Medications   hydrocortisone 2.5 % cream     No orders of the defined types were placed in this encounter.  This visit occurred during the SARS-CoV-2 public health emergency.  Safety protocols were in place, including screening questions prior to the visit, additional usage of staff PPE, and extensive cleaning of exam room while observing appropriate contact time as indicated for disinfecting solutions.  Romilda Garret, NP

## 2021-02-15 NOTE — Patient Instructions (Signed)
Do not believe that you have shingles Keep an eye on the rash and use the cream I sent in. Let me know if it does not get better

## 2021-04-28 ENCOUNTER — Other Ambulatory Visit: Payer: Self-pay

## 2021-04-28 ENCOUNTER — Ambulatory Visit: Payer: No Typology Code available for payment source | Admitting: Nurse Practitioner

## 2021-04-28 ENCOUNTER — Encounter: Payer: Self-pay | Admitting: Nurse Practitioner

## 2021-04-28 VITALS — BP 112/64 | HR 82 | Temp 97.4°F | Ht 69.0 in | Wt 222.4 lb

## 2021-04-28 DIAGNOSIS — F411 Generalized anxiety disorder: Secondary | ICD-10-CM

## 2021-04-28 MED ORDER — ESCITALOPRAM OXALATE 10 MG PO TABS
10.0000 mg | ORAL_TABLET | Freq: Every day | ORAL | 5 refills | Status: DC
Start: 2021-04-28 — End: 2021-06-09

## 2021-04-28 NOTE — Patient Instructions (Addendum)
Ask husband's pcp about motorized wheelchair, hoyer lift, shower chair, and home health (nurse assistant)  I instructed pt to start 1/2 tablet once daily for 1 week and then increase to a full tablet once daily continuously We discussed common side effects such as nausea, drowsiness and weight gain.  Also discussed rare but serious side effect of suicide ideation.  She is instructed to discontinue medication go directly to ED if this occurs.  Pt verbalizes understanding.   Plan follow up in 1 month to evaluate progress.    Managing Stress, Adult Feeling a certain amount of stress is normal. Stress helps our body and mind get ready to deal with the demands of life. Stress hormones can motivate you to do well at work and meet your responsibilities. But severe or long-term (chronic) stress can affect your mental and physical health. Chronic stress puts you at higher risk for: Anxiety and depression. Other health problems such as digestive problems, muscle aches, heart disease, high blood pressure, and stroke. What are the causes? Common causes of stress include: Demands from work, such as deadlines, feeling overworked, or having long hours. Pressures at home, such as money issues, disagreements with a spouse, or parenting issues. Pressures from major life changes, such as divorce, moving, loss of a loved one, or chronic illness. You may be at higher risk for stress-related problems if you: Do not get enough sleep. Are in poor health. Do not have emotional support. Have a mental health disorder such as anxiety or depression. How to recognize stress Stress can make you: Have trouble sleeping. Feel sad, anxious, irritable, or overwhelmed. Lose your appetite. Overeat or want to eat unhealthy foods. Want to use drugs or alcohol. Stress can also cause physical symptoms, such as: Sore, tense muscles, especially in the shoulders and neck. Headaches. Trouble breathing. A faster heart  rate. Stomach pain, nausea, or vomiting. Diarrhea or constipation. Trouble concentrating. Follow these instructions at home: Eating and drinking Eat a healthy diet. This includes: Eating foods that are high in fiber, such as beans, whole grains, and fresh fruits and vegetables. Limiting foods that are high in fat and processed sugars, such as fried or sweet foods. Do not skip meals or overeat. Drink enough fluid to keep your urine pale yellow. Alcohol use Do not drink alcohol if: Your health care provider tells you not to drink. You are pregnant, may be pregnant, or are planning to become pregnant. Drinking alcohol is a way some people try to ease their stress. This can be dangerous, so if you drink alcohol: Limit how much you have to: 0-1 drink a day for women. 0-2 drinks a day for men. Know how much alcohol is in your drink. In the [place] one drink equals one 12 oz bottle of beer (355 mL), one 5 oz glass of wine (148 mL), or one 1 oz glass of hard liquor (44 mL). Activity  Include 30 minutes of exercise in your daily schedule. Exercise is a good stress reducer. Include time in your day for an activity that you find relaxing. Try taking a walk, going on a bike ride, reading a book, or listening to music. Schedule your time in a way that lowers stress, and keep a regular schedule. Focus on doing what is most important to get done. Lifestyle Identify the source of your stress and your reaction to it. See a therapist who can help you change unhelpful reactions. When there are stressful events: Talk about them with family, friends, or  coworkers. Try to think realistically about stressful events and not ignore them or overreact. Try to find the positives in a stressful situation and not focus on the negatives. Cut back on responsibilities at work and home, if possible. Ask for help from friends or family members if you need it. Find ways to manage stress, such as: Mindfulness,  meditation, or deep breathing. Yoga or tai chi. Progressive muscle relaxation. Spending time in nature. Doing art, playing music, or reading. Making time for fun activities. Spending time with family and friends. Get support from family, friends, or spiritual resources. General instructions Get enough sleep. Try to go to sleep and get up at about the same time every day. Take over-the-counter and prescription medicines only as told by your health care provider. Do not use any products that contain nicotine or tobacco. These products include cigarettes, chewing tobacco, and vaping devices, such as e-cigarettes. If you need help quitting, ask your health care provider. Do not use drugs or smoke to deal with stress. Keep all follow-up visits. This is important. Where to find support Talk with your health care provider about stress management or finding a support group. Find a therapist to work with you on your stress management techniques. Where to find more information [employer] on Mental Illness: www.nami.org American Psychological Association: TVSte[employer].ch Contact a health care provider if: Your stress symptoms get worse. You are unable to manage your stress at home. You are struggling to stop using drugs or alcohol. Get help right away if: You may be a danger to yourself or others. You have any thoughts of death or suicide. Get help right awayif you feel like you may hurt yourself or others, or have thoughts about taking your own life. Go to your nearest emergency room or: Call 911. Call the [facility] at 715-136-1314 or 988 in the [place] This is open 24 hours a day. Text the Crisis Text Line at 585 776 6932. Summary Feeling a certain amount of stress is normal, but severe or long-term (chronic) stress can affect your mental and physical health. Chronic stress can put you at higher risk for anxiety, depression, and other health problems such as digestive  problems, muscle aches, heart disease, high blood pressure, and stroke. You may be at higher risk for stress-related problems if you do not get enough sleep, are in poor health, lack emotional support, or have a mental health disorder such as anxiety or depression. Identify the source of your stress and your reaction to it. Try talking about stressful events with family, friends, or coworkers, finding a coping method, or getting support from spiritual resources. If you need more help, talk with your health care provider about finding a support group or a mental health therapist. This information is not intended to replace advice given to you by your health care provider. Make sure you discuss any questions you have with your health care provider. Document Revised: 09/23/2020 Document Reviewed: 09/21/2020 Elsevier Patient Education  [place].

## 2021-04-28 NOTE — Assessment & Plan Note (Signed)
Related to husband chronic health. She is his primary caregiver. She is assisted by her daughter as well. Reports increased irritability, increased appetite, poor sleep and hair loss in last 1year. No SI or HI Reports she is unable to attend support group sessions nor appt with counselor due to husband's appts and home chores. Agreed to start lexapro 10mg  daily F/up in 3month

## 2021-04-28 NOTE — Progress Notes (Signed)
Subjective:  Patient ID: Denise Haas, female    DOB: 1946/03/08  Age: 76 y.o. MRN: [id number]  CC: Establish Care (TOC-Dr. Cirigiliano/Pt would like to discuss anxiety and depression. Pt states she has been taking care of her husband that has stage 4 cancer and would like to discuss how she has been feeling due to this. Pt has also been experiencing hair loss. )  HPI  GAD (generalized anxiety disorder) Related to husband chronic health. She is his primary caregiver. She is assisted by her daughter as well. Reports increased irritability, increased appetite, poor sleep and hair loss in last 1year. No SI or HI Reports she is unable to attend support group sessions nor appt with counselor due to husband's appts and home chores. Agreed to start lexapro 10mg  daily F/up in 47month  Depression screen [facility] 2/9 04/28/2021 03/18/2020 03/05/2019  Decreased Interest 0 0 0  Down, Depressed, Hopeless 1 0 0  PHQ - 2 Score 1 0 0  Altered sleeping 0 - -  Tired, decreased energy 1 - -  Change in appetite 1 - -  Feeling bad or failure about yourself  0 - -  Trouble concentrating 0 - -  Moving slowly or fidgety/restless 0 - -  Suicidal thoughts 0 - -  PHQ-9 Score 3 - -  Difficult doing work/chores Somewhat difficult - -    GAD 7 : Generalized Anxiety Score 04/28/2021 02/27/2018  Nervous, Anxious, on Edge 1 1  Control/stop worrying 1 1  Worry too much - different things 1 1  Trouble relaxing 1 0  Restless 0 0  Easily annoyed or irritable 2 0  Afraid - awful might happen 1 1  Total GAD 7 Score 7 4  Anxiety Difficulty Somewhat difficult Somewhat difficult   Reviewed past Medical, Social and Family history today.  Outpatient Medications Prior to Visit  Medication Sig Dispense Refill   Biotin 300 MCG TABS Take by mouth.     Calcium Carb-Cholecalciferol (CALCIUM + D3) 600-200 MG-UNIT TABS Take 1 tablet by mouth 2 (two) times daily.     estradiol (ESTRACE) 0.1 MG/GM vaginal cream estradiol 0.01% (0.1  mg/gram) vaginal cream  Insert 1 g twice a week by vaginal route.     hydrocortisone 2.5 % cream Apply topically 2 (two) times daily. No more than 7 days in a row usage 15 g 0   latanoprost (XALATAN) 0.005 % ophthalmic solution Place 1 drop into both eyes.      Melatonin 2.5 MG CAPS Take 1 capsule by mouth Nightly.     nystatin-triamcinolone ointment (MYCOLOG) nystatin-triamcinolone 100,000 unit/gram-0.1 % topical ointment  APPLY TO THE AFFECTED AREA(S) BY TOPICAL ROUTE 2 TIMES PER DAY     OVER THE COUNTER MEDICATION Hylaurionic Acid-Take 2 capsules a day For arthritis     Polyvinyl Alcohol-Povidone (REFRESH OP) Apply to eye. Apply one drop each eye prn dry eyes     tretinoin (RETIN-A) 0.025 % cream Apply topically at bedtime. 135 g 3   clobetasol (OLUX) 0.05 % topical foam  (Patient not taking: Reported on 04/28/2021)     Pramoxine-HC (HYDROCORTISONE ACE-PRAMOXINE) 2.5-1 % CREA hydrocortisone-pramoxine 2.5 %-1 % rectal cream (Patient not taking: Reported on 04/28/2021)     Facility-Administered Medications Prior to Visit  Medication Dose Route Frequency Provider Last Rate Last Admin   0.9 %  sodium chloride infusion  500 mL Intravenous Once Jackquline Denmark, MD        ROS See HPI  Objective:  BP  112/64 (BP Location: Left Arm, Patient Position: Sitting, Cuff Size: Normal)    Pulse 82    Temp (!) 97.4 F (36.3 C) (Temporal)    Ht 5\' 9"  (1.753 m)    Wt 222 lb 6.4 oz (100.9 kg)    SpO2 97%    BMI 32.84 kg/m   Physical Exam Cardiovascular:     Rate and Rhythm: Normal rate.     Pulses: Normal pulses.  Pulmonary:     Effort: Pulmonary effort is normal.  Neurological:     Mental Status: She is alert.  Psychiatric:        Attention and Perception: Attention normal.        Mood and Affect: Mood is depressed. Affect is flat.        Speech: Speech normal.        Behavior: Behavior is cooperative.        Thought Content: Thought content normal.        Cognition and Memory: Cognition normal.         Judgment: Judgment normal.   Assessment & Plan:  This visit occurred during the SARS-CoV-2 public health emergency.  Safety protocols were in place, including screening questions prior to the visit, additional usage of staff PPE, and extensive cleaning of exam room while observing appropriate contact time as indicated for disinfecting solutions.   Denise Haas was seen today for establish care.  Diagnoses and all orders for this visit:  GAD (generalized anxiety disorder) -     escitalopram (LEXAPRO) 10 MG tablet; Take 1 tablet (10 mg total) by mouth daily.    Problem List Items Addressed This Visit       Other   GAD (generalized anxiety disorder) - Primary    Related to husband chronic health. She is his primary caregiver. She is assisted by her daughter as well. Reports increased irritability, increased appetite, poor sleep and hair loss in last 1year. No SI or HI Reports she is unable to attend support group sessions nor appt with counselor due to husband's appts and home chores. Agreed to start lexapro 10mg  daily F/up in 26month      Relevant Medications   escitalopram (LEXAPRO) 10 MG tablet    Follow-up: Return in about 4 weeks (around 05/26/2021) for anxiety f/up and CPE (fasting).  Wilfred Lacy, NP

## 2021-05-30 ENCOUNTER — Ambulatory Visit: Payer: No Typology Code available for payment source | Admitting: Nurse Practitioner

## 2021-06-09 ENCOUNTER — Encounter: Payer: Self-pay | Admitting: Nurse Practitioner

## 2021-06-09 ENCOUNTER — Ambulatory Visit (INDEPENDENT_AMBULATORY_CARE_PROVIDER_SITE_OTHER): Payer: Medicare Other | Admitting: Nurse Practitioner

## 2021-06-09 VITALS — BP 120/64 | HR 77 | Temp 96.9°F | Ht 69.25 in | Wt 222.2 lb

## 2021-06-09 DIAGNOSIS — E782 Mixed hyperlipidemia: Secondary | ICD-10-CM

## 2021-06-09 DIAGNOSIS — Z0001 Encounter for general adult medical examination with abnormal findings: Secondary | ICD-10-CM | POA: Diagnosis not present

## 2021-06-09 DIAGNOSIS — F411 Generalized anxiety disorder: Secondary | ICD-10-CM

## 2021-06-09 DIAGNOSIS — E559 Vitamin D deficiency, unspecified: Secondary | ICD-10-CM | POA: Diagnosis not present

## 2021-06-09 MED ORDER — ESCITALOPRAM OXALATE 10 MG PO TABS: 10.0000 mg | ORAL_TABLET | Freq: Every day | ORAL | 3 refills | Status: DC

## 2021-06-09 NOTE — Patient Instructions (Signed)
Go to lab for blood draw ? ?Maintain heart healthy diet and daily exercise (156mins per week, cardio and strengthening exercise) ? ?Sign medical release to get mammogram and dexa scan from Dr. Orvan Seen (GYN) ? ?Preventive Care 59 Years and Older, Female ?Preventive care refers to lifestyle choices and visits with your health care provider that can promote health and wellness. Preventive care visits are also called wellness exams. ?What can I expect for my preventive care visit? ?Counseling ?Your health care provider may ask you questions about your: ?Medical history, including: ?Past medical problems. ?Family medical history. ?Pregnancy and menstrual history. ?History of falls. ?Current health, including: ?Memory and ability to understand (cognition). ?Emotional well-being. ?Home life and relationship well-being. ?Sexual activity and sexual health. ?Lifestyle, including: ?Alcohol, nicotine or tobacco, and drug use. ?Access to firearms. ?Diet, exercise, and sleep habits. ?Work and work Statistician. ?Sunscreen use. ?Safety issues such as seatbelt and bike helmet use. ?Physical exam ?Your health care provider will check your: ?Height and weight. These may be used to calculate your BMI (body mass index). BMI is a measurement that tells if you are at a healthy weight. ?Waist circumference. This measures the distance around your waistline. This measurement also tells if you are at a healthy weight and may help predict your risk of certain diseases, such as type 2 diabetes and high blood pressure. ?Heart rate and blood pressure. ?Body temperature. ?Skin for abnormal spots. ?What immunizations do I need? ?Vaccines are usually given at various ages, according to a schedule. Your health care provider will recommend vaccines for you based on your age, medical history, and lifestyle or other factors, such as travel or where you work. ?What tests do I need? ?Screening ?Your health care provider may recommend screening tests for  certain conditions. This may include: ?Lipid and cholesterol levels. ?Hepatitis C test. ?Hepatitis B test. ?HIV (human immunodeficiency virus) test. ?STI (sexually transmitted infection) testing, if you are at risk. ?Lung cancer screening. ?Colorectal cancer screening. ?Diabetes screening. This is done by checking your blood sugar (glucose) after you have not eaten for a while (fasting). ?Mammogram. Talk with your health care provider about how often you should have regular mammograms. ?BRCA-related cancer screening. This may be done if you have a family history of breast, ovarian, tubal, or peritoneal cancers. ?Bone density scan. This is done to screen for osteoporosis. ?Talk with your health care provider about your test results, treatment options, and if necessary, the need for more tests. ?Follow these instructions at home: ?Eating and drinking ? ?Eat a diet that includes fresh fruits and vegetables, whole grains, lean protein, and low-fat dairy products. Limit your intake of foods with high amounts of sugar, saturated fats, and salt. ?Take vitamin and mineral supplements as recommended by your health care provider. ?Do not drink alcohol if your health care provider tells you not to drink. ?If you drink alcohol: ?Limit how much you have to 0-1 drink a day. ?Know how much alcohol is in your drink. In the [place] one drink equals one 12 oz bottle of beer (355 mL), one 5 oz glass of wine (148 mL), or one 1? oz glass of hard liquor (44 mL). ?Lifestyle ?Brush your teeth every morning and night with fluoride toothpaste. Floss one time each day. ?Exercise for at least 30 minutes 5 or more days each week. ?Do not use any products that contain nicotine or tobacco. These products include cigarettes, chewing tobacco, and vaping devices, such as e-cigarettes. If you need help quitting, ask  your health care provider. ?Do not use drugs. ?If you are sexually active, practice safe sex. Use a condom or other form of protection in  order to prevent STIs. ?Take aspirin only as told by your health care provider. Make sure that you understand how much to take and what form to take. Work with your health care provider to find out whether it is safe and beneficial for you to take aspirin daily. ?Ask your health care provider if you need to take a cholesterol-lowering medicine (statin). ?Find healthy ways to manage stress, such as: ?Meditation, yoga, or listening to music. ?Journaling. ?Talking to a trusted person. ?Spending time with friends and family. ?Minimize exposure to UV radiation to reduce your risk of skin cancer. ?Safety ?Always wear your seat belt while driving or riding in a vehicle. ?Do not drive: ?If you have been drinking alcohol. Do not ride with someone who has been drinking. ?When you are tired or distracted. ?While texting. ?If you have been using any mind-altering substances or drugs. ?Wear a helmet and other protective equipment during sports activities. ?If you have firearms in your house, make sure you follow all gun safety procedures. ?What's next? ?Visit your health care provider once a year for an annual wellness visit. ?Ask your health care provider how often you should have your eyes and teeth checked. ?Stay up to date on all vaccines. ?This information is not intended to replace advice given to you by your health care provider. Make sure you discuss any questions you have with your health care provider. ?Document Revised: 08/25/2020 Document Reviewed: 08/25/2020 ?Elsevier Patient Education ? [place]. ? ?

## 2021-06-09 NOTE — Progress Notes (Signed)
? ?Complete physical exam ? ?Patient: Denise Haas   DOB: January 04, 1946   76 y.o. Female  MRN: [id number] ?Visit Date: 06/09/2021 ? ?Subjective:  ?  ?Chief Complaint  ?Patient presents with  ? Annual Exam  ?  Physical-No breast or pap exam needed, pt has GYN.  ?Pt is fasting.  ?Pt also c/o itching ears.   ? ?Denise Haas is a 76 y.o. female who presents today for a complete physical exam. She reports consuming a general diet.  Walking daily  She generally feels fairly wellfairly well. She reports sleeping . She does have additional problems to discuss today.  ?Vision:Yes ?Dental:Yes ?Positive fecal DNA, colonoscopy done 2020: normal repeat in 77yr if needed ? ?Most recent fall risk assessment: ? ?  06/09/2021  ? 10:44 AM  ?Fall Risk   ?Falls in the past year? 0  ?Number falls in past yr: 0  ?Injury with Fall? 0  ?Risk for fall due to : No Fall Risks  ?Follow up Falls evaluation completed  ? ?Most recent depression screenings: ? ?  06/09/2021  ? 10:44 AM 04/28/2021  ?  1:08 PM  ?PHQ 2/9 Scores  ?PHQ - 2 Score 0 1  ?PHQ- 9 Score 0 3  ? ? ?HPI  ?GAD (generalized anxiety disorder) ?Improved mood with lexapro '10mg'$  ?Denies any adverse side effects ? ?Maintain med dose ?Refill sent ?F/up in 3-67month? ? ?Past Medical History:  ?Diagnosis Date  ? Abnormal liver function test 02/20/2015  ? Acne   ? Acute bronchitis 02/20/2015  ? Allergy   ? multiple drug allergies  ? Anxiety   ? Arthritis   ? Cataract   ? Diarrhea 03/29/2018  ? with gas for over a month  ? GERD (gastroesophageal reflux disease)   ? no meds  ? Glaucoma   ? H/O measles   ? H/O mumps   ? History of chicken pox   ? History of colonic polyps   ? History of colonic polyps 02/21/2016  ? Neuromuscular disorder ([facility]  ? 09/12/18 pt. denies  ? Osteopenia   ? Seborrhea capitis 08/28/2016  ? ?Past Surgical History:  ?[place]  [place]  ? BREAST SURGERY  1980's  ? benign left breast biopsy for cyst  ? COLONOSCOPY    ? EYE  SURGERY    ? cataracts b/l  ? ?Social History  ? ?Socioeconomic History  ? Marital status: Married  ?  Spouse name: Not on file  ? Number of children: 2  ? Years of education: Not on file  ? Highest education level: Not on file  ?Occupational History  ? Not on file  ?Tobacco Use  ? Smoking status: Never  ? Smokeless tobacco: Never  ?Vaping Use  ? Vaping Use: Never used  ?Substance and Sexual Activity  ? Alcohol use: Yes  ?  Alcohol/week: 14.0 standard drinks  ?  Types: 14 Glasses of wine per week  ? Drug use: No  ? Sexual activity: Never  ?  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 wears seat belt  ?Other Topics Concern  ? Not on file  ?Social History Narrative  ? Not on file  ? ?Social Determinants of Health  ? ?Financial Resource Strain: Not on file  ?Food Insecurity: Not on file  ?Transportation Needs: Not on file  ?Physical Activity: Not on file  ?Stress: Not on file  ?Social Connections: Not on  file  ?Intimate Partner Violence: Not on file  ? ?Family Status  ?Relation Name Status  ? Mother 1 Deceased  ? Father 50 Deceased  ?     smoker  ? MGM 80s Deceased  ? MGF 50s Deceased  ? PGM older,smoker Deceased  ? PGF 70s,pipe smoker Deceased  ? Sister 74 Alive  ? Brother 39 Deceased  ? Daughter 69 Alive  ? Son 29 Alive  ? Sister 34 Alive  ? Brother 78 Alive  ? Brother 25 Alive  ? Cousin M,50 Deceased  ? Cousin  Deceased  ? Neg Hx  (Not Specified)  ? ?Family History  ?Problem Relation Age of Onset  ? Cancer Mother   ?     breast  ? Heart disease Mother   ? Diabetes Mother   ? Asthma Mother   ? Hyperlipidemia Mother   ? Peripheral vascular disease Mother   ? COPD Father   ? Heart disease Father   ? Arthritis Father   ? Stroke Maternal Grandmother   ? Heart disease Maternal Grandmother   ? Heart disease Maternal Grandfather   ? Heart attack Maternal Grandfather   ? Cancer Paternal Grandfather   ?     throat  ? Glaucoma Sister   ? Glaucoma Brother   ? Heart disease Brother   ? Alcohol  abuse Brother   ? Depression Brother   ? Hyperlipidemia Brother   ? COPD Daughter   ? Glaucoma Brother   ? Cancer Brother   ?     skin cancer, lost tip of thumb  ? Glaucoma Brother   ? Crohn's disease Brother   ?     colostomy in place  ? Cancer Cousin   ?     colon   ? Colon cancer Cousin   ? Esophageal cancer Neg Hx   ? Rectal cancer Neg Hx   ? Stomach cancer Neg Hx   ? ?Allergies  ?Allergen Reactions  ? Amoxicillin Hives  ? Aspirin Hives  ? Erythromycin Hives and Rash  ? Ibuprofen Hives  ? Latex Rash  ? Tetracyclines & Related Hives  ? Triple Antibiotic [Bacitracin-Neomycin-Polymyxin] Rash  ? Codeine   ?  Codeine cough syrup cause a rash and hives  ? Flonase [Fluticasone]   ?  Flonase causes hives,rash  ? Gabapentin Swelling  ?  ankles swelling.   ?  ?Patient Care Team: ?Mamie Hundertmark, Charlene Brooke, NP as PCP - General (Internal Medicine) ?Katy Apo, MD as Consulting Physician (Ophthalmology) ?Dorna Leitz, MD as Consulting Physician (Orthopedic Surgery)  ? ?Medications: ?Outpatient Medications Prior to Visit  ?Medication Sig  ? Biotin 300 MCG TABS Take by mouth.  ? Calcium Carb-Cholecalciferol (CALCIUM + D3) 600-200 MG-UNIT TABS Take 1 tablet by mouth 2 (two) times daily.  ? estradiol (ESTRACE) 0.1 MG/GM vaginal cream estradiol 0.01% (0.1 mg/gram) vaginal cream ? Insert 1 g twice a week by vaginal route.  ? hydrocortisone 2.5 % cream Apply topically 2 (two) times daily. No more than 7 days in a row usage  ? latanoprost (XALATAN) 0.005 % ophthalmic solution Place 1 drop into both eyes.   ? Melatonin 2.5 MG CAPS Take 1 capsule by mouth Nightly.  ? nystatin-triamcinolone ointment (MYCOLOG) nystatin-triamcinolone 100,000 unit/gram-0.1 % topical ointment ? APPLY TO THE AFFECTED AREA(S) BY TOPICAL ROUTE 2 TIMES PER DAY  ? OVER THE COUNTER MEDICATION Hylaurionic Acid-Take 2 capsules a day ?For arthritis  ? Polyvinyl Alcohol-Povidone (REFRESH OP) Apply to eye. Apply one drop  each eye prn dry eyes  ? tretinoin (RETIN-A)  0.025 % cream Apply topically at bedtime.  ? [DISCONTINUED] escitalopram (LEXAPRO) 10 MG tablet Take 1 tablet (10 mg total) by mouth daily.  ? ?Facility-Administered Medications Prior to Visit  ?Medication Dose Route Frequency Provider  ? 0.9 %  sodium chloride infusion  500 mL Intravenous Once Jackquline Denmark, MD  ? ? ?Review of Systems  ?Constitutional:  Negative for fever.  ?HENT:  Negative for congestion, ear discharge, ear pain, postnasal drip, rhinorrhea, sinus pressure, sinus pain, sneezing and sore throat.   ?Eyes:   ?     Negative for visual changes  ?Respiratory:  Negative for cough and shortness of breath.   ?Cardiovascular:  Negative for chest pain, palpitations and leg swelling.  ?Gastrointestinal:  Negative for blood in stool, constipation and diarrhea.  ?Genitourinary:  Negative for dysuria, frequency and urgency.  ?Musculoskeletal:  Negative for myalgias.  ?Skin:  Negative for rash.  ?Neurological:  Negative for dizziness and headaches.  ?Hematological:  Does not bruise/bleed easily.  ?Psychiatric/Behavioral:  Negative for suicidal ideas. The patient is not nervous/anxious.   ? ? ?   ?Objective:  ?BP 120/64 (BP Location: Left Arm, Patient Position: Sitting, Cuff Size: Large)   Pulse 77   Temp (!) 96.9 ?F (36.1 ?C) (Temporal)   Ht 5' 9.25" (1.759 m)   Wt 222 lb 3.2 oz (100.8 kg)   SpO2 97%   BMI 32.58 kg/m?  ?  ? ? ? ?Physical Exam ?Vitals reviewed.  ?Constitutional:   ?   General: She is not in acute distress. ?   Appearance: She is well-developed. She is obese.  ?HENT:  ?   Right Ear: Ear canal and external ear normal.  ?   Left Ear: Tympanic membrane, ear canal and external ear normal.  ?Eyes:  ?   Extraocular Movements: Extraocular movements intact.  ?   Conjunctiva/sclera: Conjunctivae normal.  ?Cardiovascular:  ?   Rate and Rhythm: Normal rate and regular rhythm.  ?   Pulses: Normal pulses.  ?   Heart sounds: Normal heart sounds.  ?Pulmonary:  ?   Effort: Pulmonary effort is normal. No  respiratory distress.  ?   Breath sounds: Normal breath sounds.  ?Chest:  ?   Chest wall: No tenderness.  ?Abdominal:  ?   General: Bowel sounds are normal.  ?   Palpations: Abdomen is soft.  ?Genitourinary: ?   Comm

## 2021-06-09 NOTE — Assessment & Plan Note (Signed)
Improved mood with lexapro '10mg'$  ?Denies any adverse side effects ? ?Maintain med dose ?Refill sent ?F/up in 3-33month ?

## 2021-06-10 LAB — COMPREHENSIVE METABOLIC PANEL
AG Ratio: 1.5 (calc) (ref 1.0–2.5)
ALT: 19 U/L (ref 6–29)
AST: 16 U/L (ref 10–35)
Albumin: 4.3 g/dL (ref 3.6–5.1)
Alkaline phosphatase (APISO): 52 U/L (ref 37–153)
BUN: 12 mg/dL (ref 7–25)
CO2: 23 mmol/L (ref 20–32)
Calcium: 9.9 mg/dL (ref 8.6–10.4)
Chloride: 105 mmol/L (ref 98–110)
Creat: 0.83 mg/dL (ref 0.60–1.00)
Globulin: 2.8 g/dL (calc) (ref 1.9–3.7)
Glucose, Bld: 98 mg/dL (ref 65–99)
Potassium: 4.6 mmol/L (ref 3.5–5.3)
Sodium: 141 mmol/L (ref 135–146)
Total Bilirubin: 0.8 mg/dL (ref 0.2–1.2)
Total Protein: 7.1 g/dL (ref 6.1–8.1)

## 2021-06-10 LAB — CBC
HCT: 42.3 % (ref 35.0–45.0)
Hemoglobin: 13.8 g/dL (ref 11.7–15.5)
MCH: 30.8 pg (ref 27.0–33.0)
MCHC: 32.6 g/dL (ref 32.0–36.0)
MCV: 94.4 fL (ref 80.0–100.0)
MPV: 10.7 fL (ref 7.5–12.5)
Platelets: 285 10*3/uL (ref 140–400)
RBC: 4.48 10*6/uL (ref 3.80–5.10)
RDW: 12.6 % (ref 11.0–15.0)
WBC: 5.2 10*3/uL (ref 3.8–10.8)

## 2021-06-10 LAB — LIPID PANEL
Cholesterol: 236 mg/dL — ABNORMAL HIGH (ref <200)
HDL: 67 mg/dL (ref >=50)
LDL Cholesterol (Calc): 146 mg/dL (calc) — ABNORMAL HIGH
Non-HDL Cholesterol (Calc): 169 mg/dL (calc) — ABNORMAL HIGH (ref <130)
Total CHOL/HDL Ratio: 3.5 (calc) (ref <5.0)
Triglycerides: 110 mg/dL (ref <150)

## 2021-06-10 LAB — TSH: TSH: 1.26 mIU/L (ref 0.40–4.50)

## 2021-06-10 LAB — VITAMIN D 25 HYDROXY (VIT D DEFICIENCY, FRACTURES): Vit D, 25-Hydroxy: 27 ng/mL — ABNORMAL LOW (ref 30–100)

## 2021-06-15 ENCOUNTER — Encounter: Payer: Self-pay | Admitting: Nurse Practitioner

## 2021-06-15 ENCOUNTER — Encounter (INDEPENDENT_AMBULATORY_CARE_PROVIDER_SITE_OTHER): Payer: No Typology Code available for payment source | Admitting: Ophthalmology

## 2021-06-15 DIAGNOSIS — E782 Mixed hyperlipidemia: Secondary | ICD-10-CM

## 2021-06-16 MED ORDER — PRAVASTATIN SODIUM 20 MG PO TABS: 20.0000 mg | ORAL_TABLET | Freq: Every day | ORAL | 3 refills | Status: DC

## 2021-07-18 DIAGNOSIS — H401131 Primary open-angle glaucoma, bilateral, mild stage: Secondary | ICD-10-CM | POA: Diagnosis not present

## 2021-08-24 DIAGNOSIS — H401131 Primary open-angle glaucoma, bilateral, mild stage: Secondary | ICD-10-CM | POA: Diagnosis not present

## 2021-09-19 ENCOUNTER — Encounter (INDEPENDENT_AMBULATORY_CARE_PROVIDER_SITE_OTHER): Payer: Self-pay | Admitting: Ophthalmology

## 2021-09-19 ENCOUNTER — Ambulatory Visit (INDEPENDENT_AMBULATORY_CARE_PROVIDER_SITE_OTHER): Payer: No Typology Code available for payment source | Admitting: Ophthalmology

## 2021-09-19 DIAGNOSIS — Z961 Presence of intraocular lens: Secondary | ICD-10-CM

## 2021-09-19 DIAGNOSIS — H43812 Vitreous degeneration, left eye: Secondary | ICD-10-CM

## 2021-09-19 DIAGNOSIS — H35372 Puckering of macula, left eye: Secondary | ICD-10-CM | POA: Diagnosis not present

## 2021-09-19 DIAGNOSIS — H43811 Vitreous degeneration, right eye: Secondary | ICD-10-CM

## 2021-09-19 NOTE — Assessment & Plan Note (Signed)

## 2021-09-19 NOTE — Patient Instructions (Signed)
The nature of macular pucker (epiretinal membrane ERM) was discussed with the patient as well as threshold criteria for vitrectomy surgery. I explained that in rare cases another surgery is needed to actually remove a second wrinkle should it regrow.  Most often, the epiretinal membrane and underlying wrinkled internal limiting membrane are removed with the first surgery, to accomplish the goals.   If the operative eye is Phakic (natural lens still present), cataract surgery is often recommended prior to Vitrectomy. This will enable the retina surgeon to have the best view during surgery and the patient to obtain optimal results in the future. Treatment options were discussed.  I have recommended at home monitoring the near vision task in a monocular (1 eye at a time), with or without near vision glasses, to look for changes or declines in reading.  If these changes occur the impact on the epiretinal membrane may become more prominent and requires reevaluation

## 2021-09-19 NOTE — Progress Notes (Signed)
09/19/2021     CHIEF COMPLAINT Patient presents for  Chief Complaint  Patient presents with   Posterior Vitreous Detachment      HISTORY OF PRESENT ILLNESS: Denise Haas is a 76 y.o. female who presents to the clinic today for:   HPI   1 YR FU OU OCT. Pt stated vision has been stable since last visit. Pt c/o of "pixilated vision" but is unsure of which eye.   Last edited by Silvestre Moment on 09/19/2021 12:59 PM.      Referring physician: Katy Apo, MD [place],   [id number]  HISTORICAL INFORMATION:   Selected notes from the MEDICAL RECORD NUMBER       CURRENT MEDICATIONS: Current Outpatient Medications (Ophthalmic Drugs)  Medication Sig   latanoprost (XALATAN) 0.005 % ophthalmic solution Place 1 drop into both eyes.    Polyvinyl Alcohol-Povidone (REFRESH OP) Apply to eye. Apply one drop each eye prn dry eyes   No current facility-administered medications for this visit. (Ophthalmic Drugs)   Current Outpatient Medications (Other)  Medication Sig   Biotin 300 MCG TABS Take by mouth.   Calcium Carb-Cholecalciferol (CALCIUM + D3) 600-200 MG-UNIT TABS Take 1 tablet by mouth 2 (two) times daily.   escitalopram (LEXAPRO) 10 MG tablet Take 1 tablet (10 mg total) by mouth daily.   estradiol (ESTRACE) 0.1 MG/GM vaginal cream estradiol 0.01% (0.1 mg/gram) vaginal cream  Insert 1 g twice a week by vaginal route.   hydrocortisone 2.5 % cream Apply topically 2 (two) times daily. No more than 7 days in a row usage   Melatonin 2.5 MG CAPS Take 1 capsule by mouth Nightly.   nystatin-triamcinolone ointment (MYCOLOG) nystatin-triamcinolone 100,000 unit/gram-0.1 % topical ointment  APPLY TO THE AFFECTED AREA(S) BY TOPICAL ROUTE 2 TIMES PER DAY   OVER THE COUNTER MEDICATION Hylaurionic Acid-Take 2 capsules a day For arthritis   pravastatin (PRAVACHOL) 20 MG tablet Take 1 tablet (20 mg total) by mouth daily.   tretinoin (RETIN-A) 0.025 % cream Apply topically at  bedtime.   Current Facility-Administered Medications (Other)  Medication Route   0.9 %  sodium chloride infusion Intravenous      REVIEW OF SYSTEMS: ROS   Negative for: Constitutional, Gastrointestinal, Neurological, Skin, Genitourinary, Musculoskeletal, HENT, Endocrine, Cardiovascular, Eyes, Respiratory, Psychiatric, Allergic/Imm, Heme/Lymph Last edited by Silvestre Moment on 09/19/2021 12:59 PM.       ALLERGIES Allergies  Allergen Reactions   Amoxicillin Hives   Aspirin Hives   Erythromycin Hives and Rash   Ibuprofen Hives   Latex Rash   Tetracyclines & Related Hives   Triple Antibiotic [Bacitracin-Neomycin-Polymyxin] Rash   Codeine     Codeine cough syrup cause a rash and hives   Flonase [Fluticasone]     Flonase causes hives,rash   Gabapentin Swelling    ankles swelling.     PAST MEDICAL HISTORY Past Medical History:  Diagnosis Date   Abnormal liver function test 02/20/2015   Acne    Acute bronchitis 02/20/2015   Allergy    multiple drug allergies   Anxiety    Arthritis    Cataract    Diarrhea 03/29/2018   with gas for over a month   GERD (gastroesophageal reflux disease)    no meds   Glaucoma    H/O measles    H/O mumps    History of chicken pox    History of colonic polyps    History of colonic polyps 02/21/2016   Neuromuscular disorder (  [place])    09/12/18 pt. denies   Osteopenia    Seborrhea capitis 08/28/2016   Past Surgical History:  Procedure Laterality Date   BACK SURGERY  1995   cage placed in lumbar   BREAST SURGERY  1980's   benign left breast biopsy for cyst   COLONOSCOPY     EYE SURGERY     cataracts b/l    FAMILY HISTORY Family History  Problem Relation Age of Onset   Cancer Mother        breast   Heart disease Mother    Diabetes Mother    Asthma Mother    Hyperlipidemia Mother    Peripheral vascular disease Mother    COPD Father    Heart disease Father    Arthritis Father    Stroke Maternal Grandmother    Heart disease Maternal  Grandmother    Heart disease Maternal Grandfather    Heart attack Maternal Grandfather    Cancer Paternal Grandfather        throat   Glaucoma Sister    Glaucoma Brother    Heart disease Brother    Alcohol abuse Brother    Depression Brother    Hyperlipidemia Brother    COPD Daughter    Glaucoma Brother    Cancer Brother        skin cancer, lost tip of thumb   Glaucoma Brother    Crohn's disease Brother        colostomy in place   Cancer Cousin        colon    Colon cancer Cousin    Esophageal cancer Neg Hx    Rectal cancer Neg Hx    Stomach cancer Neg Hx     SOCIAL HISTORY Social History   Tobacco Use   Smoking status: Never   Smokeless tobacco: Never  Vaping Use   Vaping Use: Never used  Substance Use Topics   Alcohol use: Yes    Alcohol/week: 14.0 standard drinks of alcohol    Types: 14 Glasses of wine per week   Drug use: No         OPHTHALMIC EXAM:  Base Eye Exam     Visual Acuity (ETDRS)       Right Left   Dist [place] 20/20 20/30   Dist ph [place]  20/25 -2         Tonometry (Tonopen, 1:04 PM)       Right Left   Pressure 26 22         Pupils       Pupils APD   Right PERRL None   Left PERRL None         Visual Fields       Left Right    Full Full         Extraocular Movement       Right Left    Full Full         Neuro/Psych     Oriented x3: Yes   Mood/Affect: Normal         Dilation     Both eyes: 1.0% Mydriacyl, 2.5% Phenylephrine @ 1:04 PM           Slit Lamp and Fundus Exam     External Exam       Right Left   External Normal Normal         Slit Lamp Exam       Right Left   Lids/Lashes Normal Normal  Conjunctiva/Sclera White and quiet White and quiet   Cornea Clear Clear   Anterior Chamber Deep and quiet Deep and quiet   Iris Round and reactive Round and reactive   Lens Posterior chamber intraocular lens Posterior chamber intraocular lens   Anterior Vitreous Normal Normal         Fundus  Exam       Right Left   Posterior Vitreous Posterior vitreous detachment Posterior vitreous detachment   Disc Normal Normal   C/D Ratio 0.45 0.45   Macula Normal MODERATE TOPO DISTORTION FROM  Epiretinal membrane   Vessels Normal, no abnormal vasculature, no plaques Normal   Periphery Normal Normal            IMAGING AND PROCEDURES  Imaging and Procedures for 09/19/21  OCT, Retina - OU - Both Eyes       Right Eye Quality was good. Scan locations included subfoveal. Central Foveal Thickness: 298. Progression has been stable. Findings include normal observations, normal foveal contour.   Left Eye Quality was good. Scan locations included subfoveal. Central Foveal Thickness: 348. Progression has been stable. Findings include abnormal foveal contour, epiretinal membrane, macular pucker.   Notes No change over last 3 years  with no outer retinal changes in the Photoreceptor layer      Color Fundus Photography Optos - OU - Both Eyes       Right Eye Disc findings include normal observations. Vessels : normal observations. Periphery : normal observations.   Left Eye Disc findings include normal observations. Macula : epiretinal membrane. Vessels : normal observations. Periphery : normal observations.   Notes OS with epiretinal membrane with significant thickening but no outer retinal disturbances at the layer of the photoreceptor layer we will continue this monitor and observe OS.              ASSESSMENT/PLAN:  Left epiretinal membrane OS with epiretinal membrane with significant thickening but no outer retinal disturbances at the layer of the photoreceptor layer we will continue this monitor and observe OS. The nature of macular pucker (epiretinal membrane ERM) was discussed with the patient as well as threshold criteria for vitrectomy surgery. I explained that in rare cases another surgery is needed to actually remove a second wrinkle should it regrow.  Most often,  the epiretinal membrane and underlying wrinkled internal limiting membrane are removed with the first surgery, to accomplish the goals.   If the operative eye is Phakic (natural lens still present), cataract surgery is often recommended prior to Vitrectomy. This will enable the retina surgeon to have the best view during surgery and the patient to obtain optimal results in the future. Treatment options were discussed.  I have recommended at home monitoring the near vision task in a monocular (1 eye at a time), with or without near vision glasses, to look for changes or declines in reading.  Posterior vitreous detachment of left eye  The nature of posterior vitreous detachment was discussed with the patient as well as its physiology, its age prevalence, and its possible implication regarding retinal breaks and detachment.  An informational brochure was offered to the patient.  All the patient's questions were answered.  The patient was asked to return if new or different flashes or floaters develops.   Patient was instructed to contact office immediately if any new changes were noticed. I explained to the patient that vitreous inside the eye is similar to jello inside a bowl. As the jello melts it can start to  pull away from the bowl, similarly the vitreous throughout our lives can begin to pull away from the retina. That process is called a posterior vitreous detachment. In some cases, the vitreous can tug hard enough on the retina to form a retinal tear. I discussed with the patient the signs and symptoms of a retinal detachment.  Do not rub the eye.    Posterior vitreous detachment of right eye  The nature of posterior vitreous detachment was discussed with the patient as well as its physiology, its age prevalence, and its possible implication regarding retinal breaks and detachment.  An informational brochure was offered to the patient.  All the patient's questions were answered.  The patient was asked  to return if new or different flashes or floaters develops.   Patient was instructed to contact office immediately if any new changes were noticed. I explained to the patient that vitreous inside the eye is similar to jello inside a bowl. As the jello melts it can start to pull away from the bowl, similarly the vitreous throughout our lives can begin to pull away from the retina. That process is called a posterior vitreous detachment. In some cases, the vitreous can tug hard enough on the retina to form a retinal tear. I discussed with the patient the signs and symptoms of a retinal detachment.  Do not rub the eye.       ICD-10-CM   1. Posterior vitreous detachment of right eye  H43.811 OCT, Retina - OU - Both Eyes    Color Fundus Photography Optos - OU - Both Eyes    2. Left epiretinal membrane  H35.372     3. Posterior vitreous detachment of left eye  H43.812       1.  OS with epiretinal membrane no impact on acuity at this time.  Patient will be instructed on how to monitor this on an ongoing basis with near vision reading.  2.  Intraocular pressure borderline OD, low risk for glaucoma but nonetheless needs follow-up with Dr. Katy Apo regarding Tono-Pen pressure 26 right eye and 22 left  3.  Ophthalmic Meds Ordered this visit:  No orders of the defined types were placed in this encounter.      Return in about 1 year (around 09/20/2022) for DILATE OU, OCT.  Patient Instructions  The nature of macular pucker (epiretinal membrane ERM) was discussed with the patient as well as threshold criteria for vitrectomy surgery. I explained that in rare cases another surgery is needed to actually remove a second wrinkle should it regrow.  Most often, the epiretinal membrane and underlying wrinkled internal limiting membrane are removed with the first surgery, to accomplish the goals.   If the operative eye is Phakic (natural lens still present), cataract surgery is often recommended prior to  Vitrectomy. This will enable the retina surgeon to have the best view during surgery and the patient to obtain optimal results in the future. Treatment options were discussed.  I have recommended at home monitoring the near vision task in a monocular (1 eye at a time), with or without near vision glasses, to look for changes or declines in reading.  If these changes occur the impact on the epiretinal membrane may become more prominent and requires reevaluation   Explained the diagnoses, plan, and follow up with the patient and they expressed understanding.  Patient expressed understanding of the importance of proper follow up care.   Clent Demark Redell Bhandari M.D. Diseases & Surgery of the  Retina and Vitreous [facility] [place] 09/19/21     Abbreviations: M myopia (nearsighted); A astigmatism; H hyperopia (farsighted); P presbyopia; Mrx spectacle prescription;  CTL contact lenses; OD right eye; OS left eye; OU both eyes  XT exotropia; ET esotropia; PEK punctate epithelial keratitis; PEE punctate epithelial erosions; DES dry eye syndrome; MGD meibomian gland dysfunction; ATs artificial tears; PFAT's preservative free artificial tears; [place] nuclear sclerotic cataract; PSC posterior subcapsular cataract; ERM epi-retinal membrane; PVD posterior vitreous detachment; RD retinal detachment; DM diabetes mellitus; DR diabetic retinopathy; NPDR non-proliferative diabetic retinopathy; PDR proliferative diabetic retinopathy; CSME clinically significant macular edema; DME diabetic macular edema; dbh dot blot hemorrhages; CWS cotton wool spot; POAG primary open angle glaucoma; C/D cup-to-disc ratio; HVF humphrey visual field; GVF goldmann visual field; OCT optical coherence tomography; IOP intraocular pressure; BRVO Branch retinal vein occlusion; CRVO central retinal vein occlusion; CRAO central retinal artery occlusion; BRAO branch retinal artery occlusion; RT retinal tear; SB scleral buckle; PPV pars plana  vitrectomy; VH Vitreous hemorrhage; PRP panretinal laser photocoagulation; IVK intravitreal kenalog; VMT vitreomacular traction; MH Macular hole;  NVD neovascularization of the disc; NVE neovascularization elsewhere; AREDS age related eye disease study; ARMD age related macular degeneration; POAG primary open angle glaucoma; EBMD epithelial/anterior basement membrane dystrophy; ACIOL anterior chamber intraocular lens; IOL intraocular lens; PCIOL posterior chamber intraocular lens; Phaco/IOL phacoemulsification with intraocular lens placement; Alphus [place] photorefractive keratectomy; LASIK laser assisted in situ keratomileusis; HTN hypertension; DM diabetes mellitus; COPD chronic obstructive pulmonary disease

## 2021-09-19 NOTE — Assessment & Plan Note (Signed)
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 The nature of macular pucker (epiretinal membrane ERM) was discussed with the patient as well as threshold criteria for vitrectomy surgery. I explained that in rare cases another surgery is needed to actually remove a second wrinkle should it regrow.  Most often, the epiretinal membrane and underlying wrinkled internal limiting membrane are removed with the first surgery, to accomplish the goals.   If the operative eye is Phakic (natural lens still present), cataract surgery is often recommended prior to Vitrectomy. This will enable the retina surgeon to have the best view during surgery and the patient to obtain optimal results in the future. Treatment options were discussed.  I have recommended at home monitoring the near vision task in a monocular (1 eye at a time), with or without near vision glasses, to look for changes or declines in reading.

## 2021-09-19 NOTE — Assessment & Plan Note (Addendum)

## 2021-09-19 NOTE — Assessment & Plan Note (Signed)
Under the care of Dr. Josephina Shih

## 2021-09-29 DIAGNOSIS — L649 Androgenic alopecia, unspecified: Secondary | ICD-10-CM | POA: Diagnosis not present

## 2021-09-29 DIAGNOSIS — L820 Inflamed seborrheic keratosis: Secondary | ICD-10-CM | POA: Diagnosis not present

## 2021-09-29 DIAGNOSIS — L219 Seborrheic dermatitis, unspecified: Secondary | ICD-10-CM | POA: Diagnosis not present

## 2021-10-26 DIAGNOSIS — K649 Unspecified hemorrhoids: Secondary | ICD-10-CM | POA: Diagnosis not present

## 2021-10-31 ENCOUNTER — Encounter: Payer: Self-pay | Admitting: Nurse Practitioner

## 2021-10-31 ENCOUNTER — Telehealth: Payer: Self-pay | Admitting: Nurse Practitioner

## 2021-10-31 NOTE — Telephone Encounter (Signed)
Sent pt MyChart message.

## 2021-10-31 NOTE — Telephone Encounter (Signed)
Caller Name: Chriselda Leppert Call back phone #: [phone]  Reason for Call: Pt would like to increase the strength of her prescription for Lexapro. Preferred pharmacy  [id number] [place], [place]. AT [address] [address] Phone:  [phone]  Fax:  [phone]

## 2021-11-11 ENCOUNTER — Ambulatory Visit (INDEPENDENT_AMBULATORY_CARE_PROVIDER_SITE_OTHER): Payer: Medicare Other | Admitting: Nurse Practitioner

## 2021-11-11 ENCOUNTER — Encounter: Payer: Self-pay | Admitting: Nurse Practitioner

## 2021-11-11 VITALS — BP 144/64 | HR 73 | Temp 97.3°F | Ht 69.0 in | Wt 227.0 lb

## 2021-11-11 DIAGNOSIS — Z636 Dependent relative needing care at home: Secondary | ICD-10-CM

## 2021-11-11 DIAGNOSIS — F411 Generalized anxiety disorder: Secondary | ICD-10-CM | POA: Diagnosis not present

## 2021-11-11 MED ORDER — ESCITALOPRAM OXALATE 20 MG PO TABS: 20.0000 mg | ORAL_TABLET | Freq: Every day | ORAL | 3 refills | Status: DC

## 2021-11-11 NOTE — Progress Notes (Signed)
Established Patient Visit  Patient: Denise Haas   DOB: 1945-04-18   76 y.o. Female  MRN: [id number] Visit Date: 11/11/2021  Subjective:    Chief Complaint  Patient presents with   Acute Visit    C/o increased anxiety due to stress & husband's illness    HPI GAD (generalized anxiety disorder) Increase anxiety due to husband's declining health (at home with hospice, needs 2assist to get out of bed), conflict between husband and daughter. She has some support from her sisters, but it is temporal because they live out of state. Husband's family lives in [place] and he is estranged from his children. She feels overwhelmed with the level of care needed and no time for herself.  Increased lexapro to '20mg'$  Advised to discuss need of nurse aid with hospice social worker, establish with support group and appt with psychologist. She stated she will be able to schedule those appts if she had get additional help with her husband. F/up in 66month    11/11/2021    2:05 PM 06/09/2021   10:44 AM 04/28/2021    1:08 PM  Depression screen PHQ 2/9  Decreased Interest 1 0 0  Down, Depressed, Hopeless 1 0 1  PHQ - 2 Score 2 0 1  Altered sleeping 1 0 0  Tired, decreased energy 1 0 1  Change in appetite 1 0 1  Feeling bad or failure about yourself  0 0 0  Trouble concentrating 1 0 0  Moving slowly or fidgety/restless 1 0 0  Suicidal thoughts 0 0 0  PHQ-9 Score 7 0 3  Difficult doing work/chores Somewhat difficult  Somewhat difficult       11/11/2021    2:05 PM 06/09/2021   10:45 AM 04/28/2021    1:08 PM 02/27/2018    1:15 PM  GAD 7 : Generalized Anxiety Score  Nervous, Anxious, on Edge 1 0 1 1  Control/stop worrying 1 0 1 1  Worry too much - different things 1 0 1 1  Trouble relaxing 1 0 1 0  Restless 0 0 0 0  Easily annoyed or irritable 2 0 2 0  Afraid - awful might happen 1 0 1 1  Total GAD 7 Score 7 0 7 4  Anxiety Difficulty Somewhat difficult  Somewhat difficult Somewhat  difficult   Reviewed medical, surgical, and social history today  Medications: Outpatient Medications Prior to Visit  Medication Sig   Calcium Carb-Cholecalciferol (CALCIUM + D3) 600-200 MG-UNIT TABS Take 1 tablet by mouth 2 (two) times daily.   estradiol (ESTRACE) 0.1 MG/GM vaginal cream estradiol 0.01% (0.1 mg/gram) vaginal cream  Insert 1 g twice a week by vaginal route.   hydrocortisone 2.5 % cream Apply topically 2 (two) times daily. No more than 7 days in a row usage   latanoprost (XALATAN) 0.005 % ophthalmic solution Place 1 drop into both eyes.    Melatonin 2.5 MG CAPS Take 1 capsule by mouth Nightly.   nystatin-triamcinolone ointment (MYCOLOG) nystatin-triamcinolone 100,000 unit/gram-0.1 % topical ointment  APPLY TO THE AFFECTED AREA(S) BY TOPICAL ROUTE 2 TIMES PER DAY   OVER THE COUNTER MEDICATION Hylaurionic Acid-Take 2 capsules a day For arthritis   Polyvinyl Alcohol-Povidone (REFRESH OP) Apply to eye. Apply one drop each eye prn dry eyes   tretinoin (RETIN-A) 0.025 % cream Apply topically at bedtime.   [DISCONTINUED] escitalopram (LEXAPRO) 10 MG tablet Take 1 tablet (10  mg total) by mouth daily.   Biotin 300 MCG TABS Take by mouth. (Patient not taking: Reported on 11/11/2021)   pravastatin (PRAVACHOL) 20 MG tablet Take 1 tablet (20 mg total) by mouth daily. (Patient not taking: Reported on 11/11/2021)   Facility-Administered Medications Prior to Visit  Medication Dose Route Frequency Provider   0.9 %  sodium chloride infusion  500 mL Intravenous Once Jackquline Denmark, MD   Reviewed past medical and social history.   ROS per HPI above      Objective:  BP (!) 144/64 (BP Location: Right Arm, Patient Position: Sitting, Cuff Size: Normal)   Pulse 73   Temp (!) 97.3 F (36.3 C) (Temporal)   Ht '5\' 9"'$  (1.753 m)   Wt 227 lb (103 kg)   SpO2 94%   BMI 33.52 kg/m      Physical Exam Cardiovascular:     Rate and Rhythm: Normal rate.     Pulses: Normal pulses.  Pulmonary:      Effort: Pulmonary effort is normal.  Neurological:     Mental Status: She is alert and oriented to person, place, and time.  Psychiatric:        Attention and Perception: Attention normal.        Mood and Affect: Mood is anxious.        Speech: Speech normal.        Behavior: Behavior is cooperative.        Thought Content: Thought content normal.        Cognition and Memory: Cognition and memory normal.        Judgment: Judgment normal.     No results found for any visits on 11/11/21.    Assessment & Plan:    Problem List Items Addressed This Visit       Other   GAD (generalized anxiety disorder) - Primary    Increase anxiety due to husband's declining health (at home with hospice, needs 2assist to get out of bed), conflict between husband and daughter. She has some support from her sisters, but it is temporal because they live out of state. Husband's family lives in [place] and he is estranged from his children. She feels overwhelmed with the level of care needed and no time for herself.  Increased lexapro to '20mg'$  Advised to discuss need of nurse aid with hospice social worker, establish with support group and appt with psychologist. She stated she will be able to schedule those appts if she had get additional help with her husband. F/up in 83month     Relevant Medications   escitalopram (LEXAPRO) 20 MG tablet   Other Relevant Orders   AMB Referral to [facility]  Other Visit Diagnoses     Caregiver stress       Relevant Orders   [facility]     Return in about 4 weeks (around 12/09/2021) for Welcome to medicare, and , depression and anxiety.     CWilfred Lacy NP

## 2021-11-11 NOTE — Assessment & Plan Note (Signed)
Increase anxiety due to husband's declining health (at home with hospice, needs 2assist to get out of bed), conflict between husband and daughter. She has some support from her sisters, but it is temporal because they live out of state. Husband's family lives in [place] and he is estranged from his children. She feels overwhelmed with the level of care needed and no time for herself.  Increased lexapro to '20mg'$  Advised to discuss need of nurse aid with hospice social worker, establish with support group and appt with psychologist. She stated she will be able to schedule those appts if she had get additional help with her husband. F/up in 57month

## 2021-11-11 NOTE — Patient Instructions (Signed)
Increase lexapro to '20mg'$  daily Consider joining a support group through hospice or appt with therapist

## 2021-11-15 ENCOUNTER — Ambulatory Visit: Payer: Medicare Other | Admitting: Nurse Practitioner

## 2021-11-21 ENCOUNTER — Telehealth: Payer: Self-pay | Admitting: *Deleted

## 2021-11-21 ENCOUNTER — Telehealth: Payer: Self-pay | Admitting: Nurse Practitioner

## 2021-11-21 NOTE — Chronic Care Management (AMB) (Signed)
  Care Coordination   Note   11/21/2021 Name: Denise Haas MRN: [id number] DOB: 24-Jan-1946  Denise Haas is a 76 y.o. year old female who sees Nche, Charlene Brooke, NP for primary care. I reached out to Honor Loh by phone today to offer care coordination services.  [name] was given information about Care Coordination services today including:   The Care Coordination services include support from the care team which includes your Nurse Coordinator, Clinical Social Worker, or Pharmacist.  The Care Coordination team is here to help remove barriers to the health concerns and goals most important to you. Care Coordination services are voluntary, and the patient may decline or stop services at any time by request to their care team member.   Care Coordination Consent Status: Patient did not agree to participate in care coordination services at this time patient is currently receiving care from Hospice Social Worker to assist with spouses care giver needs.    Encounter Outcome:  Pt. Refused at this time. Advised to call back to PCP Wilfred Lacy if patient is discharged from Hospice or moves to Palliative Care.   [place]  Direct Dial: [phone]

## 2021-11-21 NOTE — Telephone Encounter (Signed)
Called pt to clarify her message & to see what [facility] needed from [place].  Pt disconnected the call

## 2021-11-21 NOTE — Telephone Encounter (Signed)
Caller Name: Almeter Westhoff Call back phone #: [phone]  Reason for Call: Pt called to say she received a call from [facility] requesting that Wilfred Lacy calls them. She could not provide me with a number or give me much information as to why they want to speak with Nche.

## 2021-11-28 DIAGNOSIS — H401131 Primary open-angle glaucoma, bilateral, mild stage: Secondary | ICD-10-CM | POA: Diagnosis not present

## 2021-12-01 ENCOUNTER — Telehealth: Payer: Self-pay | Admitting: Nurse Practitioner

## 2021-12-01 DIAGNOSIS — F411 Generalized anxiety disorder: Secondary | ICD-10-CM

## 2021-12-01 MED ORDER — ESCITALOPRAM OXALATE 20 MG PO TABS: 20.0000 mg | ORAL_TABLET | Freq: Every day | ORAL | 3 refills | Status: DC

## 2021-12-01 NOTE — Telephone Encounter (Signed)
Chart supports Rx Last OV: 11/2021 Next OV: 12/2021

## 2021-12-01 NOTE — Telephone Encounter (Signed)
Pt has stated that the pharmacy [facility] - [place], [facility]  [address] [place] [address], [place] [address]  Phone:  [phone]  Fax:  [phone] has told her Baldo Ash needs to call them before they can refill her Lexapro. She is going to be out soon. Please call.

## 2021-12-05 ENCOUNTER — Telehealth: Payer: Self-pay | Admitting: Nurse Practitioner

## 2021-12-05 DIAGNOSIS — F411 Generalized anxiety disorder: Secondary | ICD-10-CM

## 2021-12-05 DIAGNOSIS — S39012A Strain of muscle, fascia and tendon of lower back, initial encounter: Secondary | ICD-10-CM | POA: Diagnosis not present

## 2021-12-05 MED ORDER — ESCITALOPRAM OXALATE 20 MG PO TABS: 20.0000 mg | ORAL_TABLET | Freq: Every day | ORAL | 3 refills | Status: DC

## 2021-12-05 NOTE — Telephone Encounter (Signed)
Caller Name: Pt Call back phone #: [phone]  Reason for Call: Pt stated that [facility] keeps telling her they cannot fill her lexapro. She would like to have it sent to [facility] instead. Please let her know when you have sent it there.

## 2021-12-08 ENCOUNTER — Telehealth: Payer: Self-pay | Admitting: Nurse Practitioner

## 2021-12-08 DIAGNOSIS — H401111 Primary open-angle glaucoma, right eye, mild stage: Secondary | ICD-10-CM | POA: Diagnosis not present

## 2021-12-08 NOTE — Telephone Encounter (Signed)
Pt stated that she can't sleep because of the hydrocortisone and want to know if you can prescribe something to make her sleep

## 2021-12-09 NOTE — Telephone Encounter (Signed)
Called & spoke w/ pt, says she will discuss it at her next visit on Monday 12/12/21.

## 2021-12-12 ENCOUNTER — Encounter: Payer: Self-pay | Admitting: Nurse Practitioner

## 2021-12-12 ENCOUNTER — Ambulatory Visit (INDEPENDENT_AMBULATORY_CARE_PROVIDER_SITE_OTHER): Payer: Medicare Other | Admitting: Nurse Practitioner

## 2021-12-12 VITALS — BP 128/70 | HR 68 | Temp 96.9°F | Ht 69.0 in | Wt 226.2 lb

## 2021-12-12 DIAGNOSIS — F411 Generalized anxiety disorder: Secondary | ICD-10-CM

## 2021-12-12 DIAGNOSIS — M545 Low back pain, unspecified: Secondary | ICD-10-CM | POA: Diagnosis not present

## 2021-12-12 MED ORDER — CYCLOBENZAPRINE HCL 5 MG PO TABS: 5.0000 mg | ORAL_TABLET | Freq: Every day | ORAL | 0 refills | Status: DC

## 2021-12-12 NOTE — Progress Notes (Signed)
Established Patient Visit  Patient: Denise Haas   DOB: 18-Dec-1945   76 y.o. Female  MRN: [id number] Visit Date: 12/12/2021  Subjective:    Chief Complaint  Patient presents with   Office Visit    Anxiety F/u  Says the increase in lexapro has been helping  C/o back pain after visiting the urgent care    Back Pain This is a new problem. The current episode started in the past 7 days. The problem occurs constantly. The problem has been gradually improving since onset. The pain is present in the lumbar spine. The quality of the pain is described as aching. The pain does not radiate. The pain is Worse during the night. The symptoms are aggravated by lying down. Pertinent negatives include no abdominal pain, bladder incontinence, bowel incontinence, chest pain, dysuria, fever, headaches, leg pain, numbness, paresis, paresthesias, pelvic pain, perianal numbness, tingling, weakness or weight loss. Risk factors include poor posture and obesity. She has tried muscle relaxant and ice for the symptoms. The treatment provided moderate relief.  Injury at home-repositioning sick husband Eval at urgent care clinic 1week ago. The following provided: Back brace and depomedrol injection,prednisone dose pack, robaxin. Reports minimal improvement. She also took ibuprofen '600mg'$  BID with moderate improvement  GAD (generalized anxiety disorder) improved and Stable mood with lexapro  Reviewed medical, surgical, and social history today  Medications: Outpatient Medications Prior to Visit  Medication Sig   Calcium Carb-Cholecalciferol (CALCIUM + D3) 600-200 MG-UNIT TABS Take 1 tablet by mouth 2 (two) times daily.   escitalopram (LEXAPRO) 20 MG tablet Take 1 tablet (20 mg total) by mouth daily.   finasteride (PROSCAR) 5 MG tablet Take by mouth.   latanoprost (XALATAN) 0.005 % ophthalmic solution Place 1 drop into both eyes.    Melatonin 2.5 MG CAPS Take 1 capsule by mouth Nightly.    nystatin-triamcinolone ointment (MYCOLOG) nystatin-triamcinolone 100,000 unit/gram-0.1 % topical ointment  APPLY TO THE AFFECTED AREA(S) BY TOPICAL ROUTE 2 TIMES PER DAY   OVER THE COUNTER MEDICATION Hylaurionic Acid-Take 2 capsules a day For arthritis   Polyvinyl Alcohol-Povidone (REFRESH OP) Apply to eye. Apply one drop each eye prn dry eyes   prednisoLONE acetate (PRED FORTE) 1 % ophthalmic suspension INSTILL ONE DROP TO THE AFFECTED EYE(S) FOUR TIMES A DAY FOR 4 DAYS   predniSONE (DELTASONE) 20 MG tablet Take by mouth.   tretinoin (RETIN-A) 0.025 % cream Apply topically at bedtime.   [DISCONTINUED] methocarbamol (ROBAXIN) 750 MG tablet Take 750 mg by mouth daily.   hydrocortisone 2.5 % cream Apply topically 2 (two) times daily. No more than 7 days in a row usage (Patient not taking: Reported on 12/12/2021)   [DISCONTINUED] Biotin 300 MCG TABS Take by mouth. (Patient not taking: Reported on 11/11/2021)   [DISCONTINUED] estradiol (ESTRACE) 0.1 MG/GM vaginal cream estradiol 0.01% (0.1 mg/gram) vaginal cream  Insert 1 g twice a week by vaginal route. (Patient not taking: Reported on 12/12/2021)   [DISCONTINUED] pravastatin (PRAVACHOL) 20 MG tablet Take 1 tablet (20 mg total) by mouth daily. (Patient not taking: Reported on 11/11/2021)   Facility-Administered Medications Prior to Visit  Medication Dose Route Frequency Provider   0.9 %  sodium chloride infusion  500 mL Intravenous Once Jackquline Denmark, MD   Reviewed past medical and social history.   ROS per HPI above      Objective:  BP 128/70 (BP Location: Right Arm, Patient Position:  Sitting, Cuff Size: Normal)   Pulse 68   Temp (!) 96.9 F (36.1 C) (Temporal)   Ht '5\' 9"'$  (1.753 m)   Wt 226 lb 3.2 oz (102.6 kg)   SpO2 96%   BMI 33.40 kg/m      Physical Exam Vitals reviewed.  Constitutional:      Appearance: She is obese.  Cardiovascular:     Rate and Rhythm: Normal rate.     Pulses: Normal pulses.  Pulmonary:     Effort:  Pulmonary effort is normal.  Musculoskeletal:        General: Tenderness present. No signs of injury. Normal range of motion.     Thoracic back: Normal.     Lumbar back: Tenderness present. No swelling or bony tenderness. Normal range of motion. Negative right straight leg raise test and negative left straight leg raise test.     Right hip: Normal.     Left hip: Normal.     Right upper leg: Normal.     Left upper leg: Normal.     Right lower leg: No edema.  Neurological:     Mental Status: She is alert.     No results found for any visits on 12/12/21.    Assessment & Plan:    Problem List Items Addressed This Visit       Other   GAD (generalized anxiety disorder) - Primary    improved and Stable mood with lexapro      Other Visit Diagnoses     Acute left-sided low back pain without sciatica       Relevant Medications   predniSONE (DELTASONE) 20 MG tablet   cyclobenzaprine (FLEXERIL) 5 MG tablet      Return in about 3 months (around 03/14/2022) for depression and anxiety.     Wilfred Lacy, NP

## 2021-12-12 NOTE — Patient Instructions (Signed)
Stop robaxin/methocarbamol Complete prednisone dose pack Use ibuprofen or tylenol as needed with food Maintain lexapro dose

## 2021-12-12 NOTE — Assessment & Plan Note (Signed)
improved and Stable mood with lexapro

## 2021-12-15 DIAGNOSIS — Z1231 Encounter for screening mammogram for malignant neoplasm of breast: Secondary | ICD-10-CM | POA: Diagnosis not present

## 2021-12-30 ENCOUNTER — Encounter: Payer: Medicare Other | Admitting: Nurse Practitioner

## 2022-01-19 DIAGNOSIS — H401131 Primary open-angle glaucoma, bilateral, mild stage: Secondary | ICD-10-CM | POA: Diagnosis not present

## 2022-02-16 DIAGNOSIS — H401131 Primary open-angle glaucoma, bilateral, mild stage: Secondary | ICD-10-CM | POA: Diagnosis not present

## 2022-03-15 ENCOUNTER — Encounter: Payer: Self-pay | Admitting: Nurse Practitioner

## 2022-03-15 ENCOUNTER — Ambulatory Visit (INDEPENDENT_AMBULATORY_CARE_PROVIDER_SITE_OTHER): Payer: Medicare Other | Admitting: Nurse Practitioner

## 2022-03-15 VITALS — BP 130/78 | HR 77 | Temp 96.5°F | Ht 69.0 in | Wt 229.0 lb

## 2022-03-15 DIAGNOSIS — F411 Generalized anxiety disorder: Secondary | ICD-10-CM

## 2022-03-15 DIAGNOSIS — E782 Mixed hyperlipidemia: Secondary | ICD-10-CM

## 2022-03-15 MED ORDER — ESCITALOPRAM OXALATE 20 MG PO TABS: 20.0000 mg | ORAL_TABLET | Freq: Every day | ORAL | 3 refills | Status: DC

## 2022-03-15 NOTE — Assessment & Plan Note (Signed)
Stable with lexapro

## 2022-03-15 NOTE — Assessment & Plan Note (Signed)
Advised about the need for DASH diet and daily exercise. Provided printed information

## 2022-03-15 NOTE — Patient Instructions (Signed)
Maintain current med dose.  Preventing High Cholesterol Cholesterol is a white, waxy substance similar to fat that the human body needs to help build cells. The liver makes all the cholesterol that a person's body needs. Having high cholesterol (hypercholesterolemia) increases your risk for heart disease and stroke. Extra or excess cholesterol comes from the food that you eat. High cholesterol can often be prevented with diet and lifestyle changes. If you already have high cholesterol, you can control it with diet, lifestyle changes, and medicines. How can high cholesterol affect me? If you have high cholesterol, fatty deposits (plaques) may build up on the walls of your blood vessels. The blood vessels that carry blood away from your heart are called arteries. Plaques make the arteries narrower and stiffer. This in turn can: Restrict or block blood flow and cause blood clots to form. Increase your risk for heart attack and stroke. What can increase my risk for high cholesterol? This condition is more likely to develop in people who: Eat foods that are high in saturated fat or cholesterol. Saturated fat is mostly found in foods that come from animal sources. Are overweight. Are not getting enough exercise. Use products that contain nicotine or tobacco, such as cigarettes, e-cigarettes, and chewing tobacco. Have a family history of high cholesterol (familial hypercholesterolemia). What actions can I take to prevent this? Nutrition  Eat less saturated fat. Avoid trans fats (partially hydrogenated oils). These are often found in margarine and in some baked goods, fried foods, and snacks bought in packages. Avoid precooked or cured meat, such as bacon, sausages, or meat loaves. Avoid foods and drinks that have added sugars. Eat more fruits, vegetables, and whole grains. Choose healthy sources of protein, such as fish, poultry, lean cuts of red meat, beans, peas, lentils, and nuts. Choose healthy  sources of fat, such as: Nuts. Vegetable oils, especially olive oil. Fish that have healthy fats, such as omega-3 fatty acids. These fish include mackerel or salmon. Lifestyle Lose weight if you are overweight. Maintaining a healthy body mass index (BMI) can help prevent or control high cholesterol. It can also lower your risk for diabetes and high blood pressure. Ask your health care provider to help you with a diet and exercise plan to lose weight safely. Do not use any products that contain nicotine or tobacco. These products include cigarettes, chewing tobacco, and vaping devices, such as e-cigarettes. If you need help quitting, ask your health care provider. Alcohol use Do not drink alcohol if: Your health care provider tells you not to drink. You are pregnant, may be pregnant, or are planning to become pregnant. If you drink alcohol: Limit how much you have to: 0-1 drink a day for women. 0-2 drinks a day for men. Know how much alcohol is in your drink. In the [place] one drink equals one 12 oz bottle of beer (355 mL), one 5 oz glass of wine (148 mL), or one 1 oz glass of hard liquor (44 mL). Activity  Get enough exercise. Do exercises as told by your health care provider. Each week, do at least 150 minutes of exercise that takes a medium level of effort (moderate-intensity exercise). This kind of exercise: Makes your heart beat faster while allowing you to still be able to talk. Can be done in short sessions several times a day or longer sessions a few times a week. For example, on 5 days each week, you could walk fast or ride your bike 3 times a day for 10  minutes each time. Medicines Your health care provider may recommend medicines to help lower cholesterol. This may be a medicine to lower the amount of cholesterol that your liver makes. You may need medicine if: Diet and lifestyle changes have not lowered your cholesterol enough. You have high cholesterol and other risk factors for  heart disease or stroke. Take over-the-counter and prescription medicines only as told by your health care provider. General information Manage your risk factors for high cholesterol. Talk with your health care provider about all your risk factors and how to lower your risk. Manage other conditions that you have, such as diabetes or high blood pressure (hypertension). Have blood tests to check your cholesterol levels at regular points in time as told by your health care provider. Keep all follow-up visits. This is important. Where to find more information American Heart Association: www.heart.org National Heart, Lung, and Blood Institute: https://wilson-eaton.com/ Summary High cholesterol increases your risk for heart disease and stroke. By keeping your cholesterol level low, you can reduce your risk for these conditions. High cholesterol can often be prevented with diet and lifestyle changes. Work with your health care provider to manage your risk factors, and have your blood tested regularly. This information is not intended to replace advice given to you by your health care provider. Make sure you discuss any questions you have with your health care provider. Document Revised: 09/30/2021 Document Reviewed: 05/03/2020 Elsevier Patient Education  [place].

## 2022-03-15 NOTE — Progress Notes (Signed)
Established Patient Visit  Patient: Denise Haas   DOB: 15-Aug-1945   77 y.o. Female  MRN: [id number] Visit Date: 03/15/2022  Subjective:    Chief Complaint  Patient presents with  . Office Visit    Anxiety/ depression  No concerns    HPI Hyperlipidemia, mixed Advised about the need for DASH diet and daily exercise. Provided printed information  GAD (generalized anxiety disorder) Stable with lexapro  Reviewed medical, surgical, and social history today  Medications: Outpatient Medications Prior to Visit  Medication Sig  . Calcium Carb-Cholecalciferol (CALCIUM + D3) 600-200 MG-UNIT TABS Take 1 tablet by mouth 2 (two) times daily.  . Melatonin 2.5 MG CAPS Take 1 capsule by mouth Nightly.  . Polyvinyl Alcohol-Povidone (REFRESH OP) Apply to eye. Apply one drop each eye prn dry eyes  . tretinoin (RETIN-A) 0.025 % cream Apply topically at bedtime.  . [DISCONTINUED] escitalopram (LEXAPRO) 20 MG tablet Take 1 tablet (20 mg total) by mouth daily.  . [DISCONTINUED] cyclobenzaprine (FLEXERIL) 5 MG tablet Take 1 tablet (5 mg total) by mouth at bedtime. (Patient not taking: Reported on 03/15/2022)  . [DISCONTINUED] finasteride (PROSCAR) 5 MG tablet Take by mouth. (Patient not taking: Reported on 03/15/2022)  . [DISCONTINUED] hydrocortisone 2.5 % cream Apply topically 2 (two) times daily. No more than 7 days in a row usage (Patient not taking: Reported on 12/12/2021)  . [DISCONTINUED] latanoprost (XALATAN) 0.005 % ophthalmic solution Place 1 drop into both eyes.  (Patient not taking: Reported on 03/15/2022)  . [DISCONTINUED] nystatin-triamcinolone ointment (MYCOLOG) nystatin-triamcinolone 100,000 unit/gram-0.1 % topical ointment  APPLY TO THE AFFECTED AREA(S) BY TOPICAL ROUTE 2 TIMES PER DAY (Patient not taking: Reported on 03/15/2022)  . [DISCONTINUED] OVER THE COUNTER MEDICATION Hylaurionic Acid-Take 2 capsules a day For arthritis (Patient not taking: Reported on 03/15/2022)  .  [DISCONTINUED] prednisoLONE acetate (PRED FORTE) 1 % ophthalmic suspension INSTILL ONE DROP TO THE AFFECTED EYE(S) FOUR TIMES A DAY FOR 4 DAYS (Patient not taking: Reported on 03/15/2022)  . [DISCONTINUED] predniSONE (DELTASONE) 20 MG tablet Take by mouth. (Patient not taking: Reported on 03/15/2022)   Facility-Administered Medications Prior to Visit  Medication Dose Route Frequency Provider  . 0.9 %  sodium chloride infusion  500 mL Intravenous Once Jackquline Denmark, MD   Reviewed past medical and social history.   ROS per HPI above      Objective:  BP 130/78 (BP Location: Right Arm, Patient Position: Sitting, Cuff Size: Normal)   Pulse 77   Temp (!) 96.5 F (35.8 C) (Temporal)   Ht '5\' 9"'$  (1.753 m)   Wt 229 lb (103.9 kg)   SpO2 96%   BMI 33.82 kg/m      Physical Exam Cardiovascular:     Rate and Rhythm: Normal rate.     Pulses: Normal pulses.  Pulmonary:     Effort: Pulmonary effort is normal.  Neurological:     Mental Status: She is alert and oriented to person, place, and time.  Psychiatric:        Mood and Affect: Mood normal.        Behavior: Behavior normal.        Thought Content: Thought content normal.    No results found for any visits on 03/15/22.    Assessment & Plan:    Problem List Items Addressed This Visit       Other   GAD (generalized anxiety disorder) -  Primary    Stable with lexapro      Relevant Medications   escitalopram (LEXAPRO) 20 MG tablet   Hyperlipidemia, mixed    Advised about the need for DASH diet and daily exercise. Provided printed information      Return in about 6 months (around 09/13/2022) for CPE (fasting), depression and anxiety f/up.     Wilfred Lacy, NP

## 2022-05-17 ENCOUNTER — Telehealth: Payer: Self-pay

## 2022-05-17 NOTE — Telephone Encounter (Signed)
Pt scheduled for NV@ 9:20 and WTM with PCP at 10:00.

## 2022-05-31 ENCOUNTER — Ambulatory Visit (INDEPENDENT_AMBULATORY_CARE_PROVIDER_SITE_OTHER): Payer: Medicare Other

## 2022-05-31 ENCOUNTER — Ambulatory Visit (INDEPENDENT_AMBULATORY_CARE_PROVIDER_SITE_OTHER): Payer: Medicare Other | Admitting: Nurse Practitioner

## 2022-05-31 VITALS — BP 130/78 | HR 80 | Temp 98.8°F | Resp 16 | Ht 68.0 in | Wt 227.4 lb

## 2022-05-31 VITALS — BP 130/78 | HR 80 | Temp 95.4°F | Ht 68.5 in | Wt 227.4 lb

## 2022-05-31 DIAGNOSIS — Z1283 Encounter for screening for malignant neoplasm of skin: Secondary | ICD-10-CM | POA: Diagnosis not present

## 2022-05-31 DIAGNOSIS — Z0000 Encounter for general adult medical examination without abnormal findings: Secondary | ICD-10-CM

## 2022-05-31 DIAGNOSIS — E559 Vitamin D deficiency, unspecified: Secondary | ICD-10-CM | POA: Diagnosis not present

## 2022-05-31 DIAGNOSIS — E782 Mixed hyperlipidemia: Secondary | ICD-10-CM

## 2022-05-31 LAB — COMPREHENSIVE METABOLIC PANEL
ALT: 16 U/L (ref 0–35)
AST: 16 U/L (ref 0–37)
Albumin: 4.3 g/dL (ref 3.5–5.2)
Alkaline Phosphatase: 54 U/L (ref 39–117)
BUN: 10 mg/dL (ref 6–23)
CO2: 29 mEq/L (ref 19–32)
Calcium: 9.8 mg/dL (ref 8.4–10.5)
Chloride: 104 mEq/L (ref 96–112)
Creatinine, Ser: 0.74 mg/dL (ref 0.40–1.20)
GFR: 78.76 mL/min (ref >60.00)
Glucose, Bld: 90 mg/dL (ref 70–99)
Potassium: 4.3 mEq/L (ref 3.5–5.1)
Sodium: 140 mEq/L (ref 135–145)
Total Bilirubin: 1 mg/dL (ref 0.2–1.2)
Total Protein: 7.3 g/dL (ref 6.0–8.3)

## 2022-05-31 LAB — LIPID PANEL
Cholesterol: 223 mg/dL — ABNORMAL HIGH (ref 0–200)
HDL: 61.4 mg/dL (ref >39.00)
LDL Cholesterol: 137 mg/dL — ABNORMAL HIGH (ref 0–99)
NonHDL: 161.38
Total CHOL/HDL Ratio: 4
Triglycerides: 124 mg/dL (ref 0.0–149.0)
VLDL: 24.8 mg/dL (ref 0.0–40.0)

## 2022-05-31 LAB — VITAMIN D 25 HYDROXY (VIT D DEFICIENCY, FRACTURES): VITD: 23.99 ng/mL — ABNORMAL LOW (ref 30.00–100.00)

## 2022-05-31 NOTE — Progress Notes (Signed)
Pt here for Welcome to Medicare Visit. Pt declined vision screen. EKG, hearing screen and vital signs completed.

## 2022-05-31 NOTE — Patient Instructions (Addendum)
Go to lab Sign medical release to get records from GYN (PAP, mammogram and bone density)  Preventive Care 65 Years and Older, Female Preventive care refers to lifestyle choices and visits with your health care provider that can promote health and wellness. Preventive care visits are also called wellness exams. What can I expect for my preventive care visit? Counseling Your health care provider may ask you questions about your: Medical history, including: Past medical problems. Family medical history. Pregnancy and menstrual history. History of falls. Current health, including: Memory and ability to understand (cognition). Emotional well-being. Home life and relationship well-being. Sexual activity and sexual health. Lifestyle, including: Alcohol, nicotine or tobacco, and drug use. Access to firearms. Diet, exercise, and sleep habits. Work and work Statistician. Sunscreen use. Safety issues such as seatbelt and bike helmet use. Physical exam Your health care provider will check your: Height and weight. These may be used to calculate your BMI (body mass index). BMI is a measurement that tells if you are at a healthy weight. Waist circumference. This measures the distance around your waistline. This measurement also tells if you are at a healthy weight and may help predict your risk of certain diseases, such as type 2 diabetes and high blood pressure. Heart rate and blood pressure. Body temperature. Skin for abnormal spots. What immunizations do I need?  Vaccines are usually given at various ages, according to a schedule. Your health care provider will recommend vaccines for you based on your age, medical history, and lifestyle or other factors, such as travel or where you work. What tests do I need? Screening Your health care provider may recommend screening tests for certain conditions. This may include: Lipid and cholesterol levels. Hepatitis C test. Hepatitis B test. HIV  (human immunodeficiency virus) test. STI (sexually transmitted infection) testing, if you are at risk. Lung cancer screening. Colorectal cancer screening. Diabetes screening. This is done by checking your blood sugar (glucose) after you have not eaten for a while (fasting). Mammogram. Talk with your health care provider about how often you should have regular mammograms. BRCA-related cancer screening. This may be done if you have a family history of breast, ovarian, tubal, or peritoneal cancers. Bone density scan. This is done to screen for osteoporosis. Talk with your health care provider about your test results, treatment options, and if necessary, the need for more tests. Follow these instructions at home: Eating and drinking  Eat a diet that includes fresh fruits and vegetables, whole grains, lean protein, and low-fat dairy products. Limit your intake of foods with high amounts of sugar, saturated fats, and salt. Take vitamin and mineral supplements as recommended by your health care provider. Do not drink alcohol if your health care provider tells you not to drink. If you drink alcohol: Limit how much you have to 0-1 drink a day. Know how much alcohol is in your drink. In the [place] one drink equals one 12 oz bottle of beer (355 mL), one 5 oz glass of wine (148 mL), or one 1 oz glass of hard liquor (44 mL). Lifestyle Brush your teeth every morning and night with fluoride toothpaste. Floss one time each day. Exercise for at least 30 minutes 5 or more days each week. Do not use any products that contain nicotine or tobacco. These products include cigarettes, chewing tobacco, and vaping devices, such as e-cigarettes. If you need help quitting, ask your health care provider. Do not use drugs. If you are sexually active, practice safe sex. Use a  condom or other form of protection in order to prevent STIs. Take aspirin only as told by your health care provider. Make sure that you understand  how much to take and what form to take. Work with your health care provider to find out whether it is safe and beneficial for you to take aspirin daily. Ask your health care provider if you need to take a cholesterol-lowering medicine (statin). Find healthy ways to manage stress, such as: Meditation, yoga, or listening to music. Journaling. Talking to a trusted person. Spending time with friends and family. Minimize exposure to UV radiation to reduce your risk of skin cancer. Safety Always wear your seat belt while driving or riding in a vehicle. Do not drive: If you have been drinking alcohol. Do not ride with someone who has been drinking. When you are tired or distracted. While texting. If you have been using any mind-altering substances or drugs. Wear a helmet and other protective equipment during sports activities. If you have firearms in your house, make sure you follow all gun safety procedures. What's next? Visit your health care provider once a year for an annual wellness visit. Ask your health care provider how often you should have your eyes and teeth checked. Stay up to date on all vaccines. This information is not intended to replace advice given to you by your health care provider. Make sure you discuss any questions you have with your health care provider. Document Revised: 08/25/2020 Document Reviewed: 08/25/2020 Elsevier Patient Education  [place].

## 2022-05-31 NOTE — Progress Notes (Signed)
Annual Wellness Visit   Patient: Denise Haas, Female    DOB: September 05, 1945, 77 y.o.   MRN: [id number] Visit Date: 06/02/2022  Subjective:    Chief Complaint  Patient presents with   Annual Exam    Welcome to Medicare and chronic condition f/up   Denise Haas is a 77 y.o. female who presents today for her Annual Wellness Visit. She reports consuming a general diet.  No exercise regimen  She generally feels well. She reports sleeping fairly well. She does not have additional problems to discuss today.  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 She adult daughter also lives with them. She provides emotional support per patient. She has Charity fundraiser 2x/week. This allows her to meet with friends 1-2x/week to play cards and have lunch. She also stays in touch with her siblings vie telephone.  SDOH Screenings   Food Insecurity: No Food Insecurity (05/31/2022)  Housing: Low Risk  (05/31/2022)  Transportation Needs: No Transportation Needs (05/31/2022)  Utilities: Not At Risk (05/31/2022)  Depression (PHQ2-9): Low Risk  (05/31/2022)  Financial Resource Strain: Low Risk  (05/31/2022)  Physical Activity: Insufficiently Active (05/31/2022)  Social Connections: Moderately Isolated (05/31/2022)  Stress: Stress Concern Present (05/31/2022)  Tobacco Use: Low Risk  (05/31/2022)        03/05/2019   10:36 AM 03/18/2020    4:19 PM 04/28/2021    1:04 PM 06/09/2021   10:44 AM 05/31/2022    9:50 AM  Fall Risk  Falls in the past year? 1  1 0 0  Was there an injury with Fall? 0 0 0 0 0  Fall Risk Category Calculator 2  1 0 0  Fall Risk Category (Retired) Moderate  Low Low   (RETIRED) Patient Fall Risk Level  Low fall risk Low fall risk Low fall risk   Patient at Risk for Falls Due to   History of fall(s) No Fall Risks No Fall Risks  Fall risk Follow up   Falls evaluation completed Falls evaluation completed      Hearing Screening   1000Hz  2000Hz  3000Hz  4000Hz  6000Hz  8000Hz   Right ear 25 30 10 30  40 45  Left ear 5 15 15 20 25  45  Vision Screening - Comments:: Patient declined   Patient Active Problem List   Diagnosis Date Noted   Vitamin D deficiency 05/31/2022   Pseudophakia of both eyes 09/19/2021   GAD (generalized anxiety disorder) 04/28/2021   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   Capsulitis of right shoulder 06/22/2020   Left epiretinal membrane 06/16/2019   Posterior vitreous detachment of right eye 06/16/2019   Posterior vitreous detachment of left eye 06/16/2019   Incomplete tear of left rotator cuff 03/01/2015   Hyperlipidemia, mixed 02/20/2015   Glaucoma 05/09/2013   Cervical radiculopathy due to degenerative joint disease of spine 05/09/2013   Past Medical History:  Diagnosis Date   Abnormal liver function test 02/20/2015   Acne    Acute bronchitis 02/20/2015   Allergy    multiple drug allergies   Anxiety    Arthritis    Cataract    Diarrhea 03/29/2018   with gas for over a month   GERD (gastroesophageal reflux disease)    no meds   Glaucoma    H/O measles    H/O mumps    History of chicken pox    History of colonic polyps    History of colonic polyps 02/21/2016  Neuromuscular disorder ([place])    09/12/18 pt. denies   Osteopenia    Seborrhea capitis 08/28/2016   Past Surgical History:  Procedure Laterality Date   [place]   cage placed in lumbar   BREAST SURGERY  1980's   benign left breast biopsy for cyst   COLONOSCOPY     EYE SURGERY     cataracts b/l   Medications: Outpatient Medications Prior to Visit  Medication Sig   Cholecalciferol 50 MCG (2000 UT) TABS Take 1 tablet by mouth daily at 12 noon.   Calcium Carb-Cholecalciferol (CALCIUM + D3) 600-200 MG-UNIT TABS Take 1 tablet by mouth 2 (two) times daily.   escitalopram (LEXAPRO) 20 MG tablet Take 1 tablet (20 mg total) by mouth daily.   Melatonin 2.5 MG CAPS Take 1 capsule by mouth Nightly.    Polyvinyl Alcohol-Povidone (REFRESH OP) Apply to eye. Apply one drop each eye prn dry eyes   tretinoin (RETIN-A) 0.025 % cream Apply topically at bedtime.   Facility-Administered Medications Prior to Visit  Medication Dose Route Frequency Provider   0.9 %  sodium chloride infusion  500 mL Intravenous Once Jackquline Denmark, MD    Allergies  Allergen Reactions   Amoxicillin Hives   Aspirin Hives   Erythromycin Hives and Rash   Latex Rash   Tetracyclines & Related Hives   Triple Antibiotic [Bacitracin-Neomycin-Polymyxin] Rash   Codeine     Codeine cough syrup cause a rash and hives   Flonase [Fluticasone]     Flonase causes hives,rash   Gabapentin Swelling    ankles swelling.    Patient Care Team: Murry Khiev, Charlene Brooke, NP as PCP - General (Internal Medicine) Katy Apo, MD as Consulting Physician (Ophthalmology) Dorna Leitz, MD as Consulting Physician (Orthopedic Surgery) Ophthalmology: [facility] ophthalmology Retina Specialist: Dr. Zadie Rhine GYN: Dr. Orvan Seen  Review of Systems  Constitutional:  Negative for activity change, appetite change and unexpected weight change.  Respiratory: Negative.    Cardiovascular: Negative.   Gastrointestinal: Negative.   Endocrine: Negative for cold intolerance and heat intolerance.  Genitourinary: Negative.   Musculoskeletal: Negative.   Skin: Negative.   Neurological: Negative.   Hematological: Negative.   Psychiatric/Behavioral:  Negative for behavioral problems, decreased concentration, dysphoric mood, hallucinations, self-injury, sleep disturbance and suicidal ideas. The patient is not nervous/anxious.       Objective:    Vitals: BP 130/78 (BP Location: Left Arm, Patient Position: Sitting, Cuff Size: Normal)   Pulse 80   Temp 98.8 F (37.1 C) (Oral)   Resp 16   Ht 5\' 8"  (1.727 m)   Wt 227 lb 6.4 oz (103.1 kg)   SpO2 98%   BMI 34.58 kg/m  BP Readings from Last 3 Encounters:  05/31/22 130/78  05/31/22 130/78  03/15/22 130/78    Wt Readings from Last 3 Encounters:  05/31/22 227 lb 6.4 oz (103.1 kg)  05/31/22 227 lb 6.4 oz (103.1 kg)  03/15/22 229 lb (103.9 kg)   Physical Exam Vitals and nursing note reviewed.  Constitutional:      General: She is not in acute distress. HENT:     Right Ear: Tympanic membrane, ear canal and external ear normal.     Left Ear: Tympanic membrane, ear canal and external ear normal.     Nose: Nose normal.  Eyes:     Extraocular Movements: Extraocular movements intact.     Conjunctiva/sclera: Conjunctivae normal.     Pupils: Pupils are equal, round, and reactive to light.  Neck:  Thyroid: No thyroid mass, thyromegaly or thyroid tenderness.  Cardiovascular:     Rate and Rhythm: Normal rate and regular rhythm.     Pulses: Normal pulses.     Heart sounds: Normal heart sounds.  Pulmonary:     Effort: Pulmonary effort is normal.     Breath sounds: Normal breath sounds.  Abdominal:     General: Bowel sounds are normal.     Palpations: Abdomen is soft.  Musculoskeletal:        General: Normal range of motion.     Cervical back: Normal range of motion and neck supple.     Right hip: Decreased strength.     Left hip: Decreased strength.     Right upper leg: Normal.     Left upper leg: Normal.     Right knee: Normal.     Left knee: Normal.     Right lower leg: No edema.     Left lower leg: No edema.     Right ankle: Normal.     Left ankle: Normal.     Right foot: Normal.     Left foot: Normal.  Lymphadenopathy:     Cervical: No cervical adenopathy.  Skin:    General: Skin is warm and dry.  Neurological:     Mental Status: She is alert and oriented to person, place, and time.     Cranial Nerves: No cranial nerve deficit.     Motor: Weakness present. No tremor, atrophy or pronator drift.     Coordination: Romberg sign negative. Coordination normal. Finger-Nose-Finger Test and Heel to [facility] Test normal.     Gait: Tandem walk abnormal. Gait normal.     Deep Tendon  Reflexes: Babinski sign absent on the right side. Babinski sign absent on the left side.     Reflex Scores:      Bicep reflexes are 2+ on the right side and 2+ on the left side.      Patellar reflexes are 2+ on the right side and 2+ on the left side. Psychiatric:        Mood and Affect: Mood normal.        Behavior: Behavior normal.        Thought Content: Thought content normal.    Most recent functional status assessment:    05/31/2022    1:06 PM  In your present state of health, do you have any difficulty performing the following activities:  Hearing? 0  Vision? 0  Difficulty concentrating or making decisions? 0  Walking or climbing stairs? 0  Dressing or bathing? 0  Doing errands, shopping? 0   Most recent fall risk assessment:    05/31/2022    9:50 AM  Fall Risk   Falls in the past year? 0  Number falls in past yr: 0  Injury with Fall? 0  Risk for fall due to : No Fall Risks   Most recent depression screenings:    05/31/2022    9:22 AM 03/15/2022   11:02 AM  PHQ 2/9 Scores  PHQ - 2 Score 0 0  PHQ- 9 Score  0   Hearing Screening   1000Hz  2000Hz  3000Hz  4000Hz  6000Hz  8000Hz   Right ear 25 30 10 30  40 45  Left ear 5 15 15 20 25  45  Vision Screening - Comments:: Patient declined  Results for orders placed or performed in visit on 05/31/22  Lipid panel  Result Value Ref Range   Cholesterol 223 (H) 0 - 200 mg/dL  Triglycerides 124.0 0.0 - 149.0 mg/dL   HDL 61.40 >39.00 mg/dL   VLDL 24.8 0.0 - 40.0 mg/dL   LDL Cholesterol 137 (H) 0 - 99 mg/dL   Total CHOL/HDL Ratio 4    NonHDL 161.38   VITAMIN D 25 Hydroxy (Vit-D Deficiency, Fractures)  Result Value Ref Range   VITD 23.99 (L) 30.00 - 100.00 ng/mL  Comprehensive metabolic panel  Result Value Ref Range   Sodium 140 135 - 145 mEq/L   Potassium 4.3 3.5 - 5.1 mEq/L   Chloride 104 96 - 112 mEq/L   CO2 29 19 - 32 mEq/L   Glucose, Bld 90 70 - 99 mg/dL   BUN 10 6 - 23 mg/dL   Creatinine, Ser 0.74 0.40 - 1.20 mg/dL    Total Bilirubin 1.0 0.2 - 1.2 mg/dL   Alkaline Phosphatase 54 39 - 117 U/L   AST 16 0 - 37 U/L   ALT 16 0 - 35 U/L   Total Protein 7.3 6.0 - 8.3 g/dL   Albumin 4.3 3.5 - 5.2 g/dL   GFR 78.76 >60.00 mL/min   Calcium 9.8 8.4 - 10.5 mg/dL      Assessment & Plan:     Annual wellness visit done today including the all of the following: Reviewed patient's Family Medical History Reviewed and updated list of patient's medical providers Assessment of cognitive impairment was done Assessed patient's functional ability Established a written schedule for health screening services Health Risk Assessent Completed and Reviewed  Discussed health benefits of physical activity, and encouraged her to engage in regular exercise appropriate for her age and condition She declined need for audiology referral Exercise Activities and Dietary recommendations: Advised to start daily exercise 20-33mins each day: cardio-walking or chair exercise, and resistant training with body weight or resistant band. May also incorporate yoga to improve stability and flexibility. Advised to follow DASH diet Advised to maintain calcium 600mg  BID and Vit. D 1000IU daily  Immunization History  Administered Date(s) Administered   Fluad Quad(high Dose 65+) 12/11/2020   H1N1 12/29/2010   Influenza Split 11/25/2015   Influenza, High Dose Seasonal PF 11/19/2017, 11/19/2017, 12/01/2021   Influenza, Quadrivalent, Recombinant, Inj, Pf 12/13/2016   Influenza,inj,Quad PF,6+ Mos 12/11/2012, 01/05/2014, 11/15/2018   Influenza-Unspecified 11/17/2009, 01/08/2012, 12/12/2014   PFIZER(Purple Top)SARS-COV-2 Vaccination 04/01/2019, 04/21/2019, 12/16/2019   Pneumococcal Conjugate-13 02/16/2015   Pneumococcal Polysaccharide-23 01/08/2012, 05/09/2013, 05/24/2018   Tdap 12/20/2009, 12/03/2017   Zoster Recombinat (Shingrix) 10/01/2017, 10/26/2017, 12/03/2017   Zoster, Live 10/09/2009  Last colonoscopy 2020: normal, repeat in 20yrs Last  bone density per GYN: request records Last mammogram per GYN: request records. Health Maintenance  Topic Date Due   COVID-19 Vaccine (4 - 2023-24 season) 06/16/2022 (Originally 11/11/2021)   Medicare Annual Wellness (AWV)  05/31/2023   DTaP/Tdap/Td (3 - Td or Tdap) 12/04/2027   Pneumonia Vaccine 36+ Years old  Completed   INFLUENZA VACCINE  Completed   DEXA SCAN  Completed   Hepatitis C Screening  Completed   Zoster Vaccines- Shingrix  Completed   HPV VACCINES  Aged Out   Fecal DNA (Cologuard)  Discontinued   Problem List Items Addressed This Visit       Other   Hyperlipidemia, mixed    Repeat lipid panel: improving TC and LDL but still not at goal. ASCVD score at 18.5% No known atherosclerosis Advised to maintain DASH or mediterranean diet and daily exercise F/up in 71months  Consider use of moderate intensity statin if no improvement  Relevant Orders   Lipid panel (Completed)   Comprehensive metabolic panel (Completed)   Lipid panel   Vitamin D deficiency    Repeat vit. D: 23 Advised to maintain OTC dose 2000IU daily with calcium 1200mg  daily. Also needs sunlight exposure as least 79mins daily      Relevant Orders   VITAMIN D 25 Hydroxy (Vit-D Deficiency, Fractures) (Completed)   Other Visit Diagnoses     Welcome to Medicare preventive visit    -  Primary   Screening exam for skin cancer       Relevant Orders   Ambulatory referral to Dermatology      Return in about 1 year (around 05/31/2023) for CPE (fasting) and intial AWV.     Wilfred Lacy, NP

## 2022-06-01 NOTE — Progress Notes (Signed)
Abnormal: Low vit. D: increase OTC dose to 2000IU daily with 35mins of sunlight exposure Abnormal lipid panel: elevated TC and LDL. Need to maintain DASH diet and daily exercise as discussed Normal CMP.

## 2022-06-02 ENCOUNTER — Encounter: Payer: Self-pay | Admitting: Nurse Practitioner

## 2022-06-02 NOTE — Assessment & Plan Note (Addendum)
Repeat lipid panel: improving TC and LDL but still not at goal. ASCVD score at 18.5% No known atherosclerosis Advised to maintain DASH or mediterranean diet and daily exercise F/up in 73months  Consider use of moderate intensity statin if no improvement

## 2022-06-02 NOTE — Assessment & Plan Note (Addendum)
Repeat vit. D: 23 Advised to maintain OTC dose 2000IU daily with calcium 1200mg  daily. Also needs sunlight exposure as least 68mins daily

## 2022-06-26 ENCOUNTER — Encounter: Payer: Self-pay | Admitting: *Deleted

## 2022-07-26 ENCOUNTER — Ambulatory Visit (INDEPENDENT_AMBULATORY_CARE_PROVIDER_SITE_OTHER): Payer: Medicare Other | Admitting: Nurse Practitioner

## 2022-07-26 ENCOUNTER — Encounter: Payer: Self-pay | Admitting: Nurse Practitioner

## 2022-07-26 VITALS — BP 130/72 | HR 75 | Temp 98.9°F | Resp 16 | Ht 68.0 in | Wt 227.0 lb

## 2022-07-26 DIAGNOSIS — J209 Acute bronchitis, unspecified: Secondary | ICD-10-CM

## 2022-07-26 MED ORDER — METHYLPREDNISOLONE 4 MG PO TBPK: ORAL_TABLET | ORAL | 0 refills | Status: DC

## 2022-07-26 MED ORDER — ALBUTEROL SULFATE HFA 108 (90 BASE) MCG/ACT IN AERS: 1.0000 | INHALATION_SPRAY | Freq: Four times a day (QID) | RESPIRATORY_TRACT | 0 refills | Status: DC | PRN

## 2022-07-26 MED ORDER — BENZONATATE 100 MG PO CAPS: 100.0000 mg | ORAL_CAPSULE | Freq: Three times a day (TID) | ORAL | 0 refills | Status: DC | PRN

## 2022-07-26 NOTE — Patient Instructions (Addendum)
Maintain adequate oral hydration Call office if no improvement in 1week 

## 2022-07-26 NOTE — Progress Notes (Signed)
Established Patient Visit  Patient: Denise Haas   DOB: Aug 31, 1945   77 y.o. Female  MRN: [id number] Visit Date: 07/26/2022  Subjective:    Chief Complaint  Patient presents with   Wheezing   Cough    Wheezing and productive cough for a couple of weeks.  Denies chest pains and sob    Cough This is a new problem. The current episode started 1 to 4 weeks ago. The problem has been unchanged. The problem occurs constantly. The cough is Productive of sputum. Associated symptoms include wheezing. Pertinent negatives include no chest pain, chills, ear congestion, ear pain, fever, headaches, heartburn, hemoptysis, myalgias, nasal congestion, postnasal drip, rash, rhinorrhea, sore throat, shortness of breath, sweats or weight loss. Nothing aggravates the symptoms. She has tried nothing for the symptoms. There is no history of asthma, bronchiectasis, bronchitis, COPD, emphysema, environmental allergies or pneumonia.  Denies any adverse reaction with previous use of oral prednisone.  Reviewed medical, surgical, and social history today  Medications: Outpatient Medications Prior to Visit  Medication Sig   Calcium Carb-Cholecalciferol (CALCIUM + D3) 600-200 MG-UNIT TABS Take 1 tablet by mouth 2 (two) times daily.   Cholecalciferol 50 MCG (2000 UT) TABS Take 1 tablet by mouth daily at 12 noon.   escitalopram (LEXAPRO) 20 MG tablet Take 1 tablet (20 mg total) by mouth daily.   Melatonin 2.5 MG CAPS Take 1 capsule by mouth Nightly.   Polyvinyl Alcohol-Povidone (REFRESH OP) Apply to eye. Apply one drop each eye prn dry eyes   Sodium Hyaluronate, oral, (HYALURONIC ACID PO) Take by mouth.   tretinoin (RETIN-A) 0.025 % cream Apply topically at bedtime.   Facility-Administered Medications Prior to Visit  Medication Dose Route Frequency Provider   0.9 %  sodium chloride infusion  500 mL Intravenous Once Lynann Bologna, MD   Reviewed past medical and social history.   ROS per HPI  above      Objective:  BP 130/72 (BP Location: Right Arm, Patient Position: Sitting, Cuff Size: Large)   Pulse 75   Temp 98.9 F (37.2 C) (Oral)   Resp 16   Ht 5\' 8"  (1.727 m)   Wt 227 lb (103 kg)   SpO2 97%   BMI 34.52 kg/m      Physical Exam Vitals and nursing note reviewed.  Constitutional:      General: She is not in acute distress. HENT:     Nose: No nasal tenderness, mucosal edema, congestion or rhinorrhea.     Right Nostril: No occlusion.     Left Nostril: No occlusion.     Right Turbinates: Not enlarged, swollen or pale.     Left Turbinates: Not enlarged, swollen or pale.     Right Sinus: No maxillary sinus tenderness or frontal sinus tenderness.     Left Sinus: No maxillary sinus tenderness or frontal sinus tenderness.     Mouth/Throat:     Pharynx: Uvula midline.     Tonsils: No tonsillar exudate or tonsillar abscesses.  Cardiovascular:     Rate and Rhythm: Normal rate and regular rhythm.     Pulses: Normal pulses.     Heart sounds: Normal heart sounds.  Pulmonary:     Effort: Pulmonary effort is normal. No respiratory distress.     Breath sounds: No stridor. Wheezing present. No rhonchi or rales.  Chest:     Chest wall: No tenderness.  Musculoskeletal:  Right lower leg: No edema.     Left lower leg: No edema.  Neurological:     Mental Status: She is alert and oriented to person, place, and time.     No results found for any visits on 07/26/22.    Assessment & Plan:    Problem List Items Addressed This Visit   None Visit Diagnoses     Acute bronchitis, unspecified organism    -  Primary   Relevant Medications   methylPREDNISolone (MEDROL DOSEPAK) 4 MG TBPK tablet   albuterol (VENTOLIN HFA) 108 (90 Base) MCG/ACT inhaler   benzonatate (TESSALON) 100 MG capsule      Return if symptoms worsen or fail to improve.     Alysia Penna, NP

## 2022-09-08 ENCOUNTER — Encounter: Payer: Self-pay | Admitting: Nurse Practitioner

## 2022-09-08 ENCOUNTER — Ambulatory Visit (INDEPENDENT_AMBULATORY_CARE_PROVIDER_SITE_OTHER): Payer: Medicare Other | Admitting: Nurse Practitioner

## 2022-09-08 VITALS — BP 150/80 | HR 71 | Resp 16 | Ht 68.0 in | Wt 226.0 lb

## 2022-09-08 DIAGNOSIS — R052 Subacute cough: Secondary | ICD-10-CM | POA: Diagnosis not present

## 2022-09-08 MED ORDER — BUDESONIDE 180 MCG/ACT IN AEPB: 1.0000 | INHALATION_SPRAY | Freq: Two times a day (BID) | RESPIRATORY_TRACT | 0 refills | Status: DC

## 2022-09-08 MED ORDER — OMEPRAZOLE 20 MG PO CPDR: 20.0000 mg | DELAYED_RELEASE_CAPSULE | Freq: Two times a day (BID) | ORAL | 0 refills | Status: DC

## 2022-09-08 NOTE — Assessment & Plan Note (Signed)
Onset 35month ago, productive cough with wheezing at hs resolved with use of medrol dose pack and SABA Symptoms returned 2weeks ago No fever, no SOB Hx of GERD with use of antacid prn. Hx of second hand tobacco smoke exposure as child and adult Clear lung sounds today Peak flow meter: 400, 400, 473ml/min  GERD vs cough variant asthma? Start omeprazole BID and ICS Continue SABA prn F/up in 35month

## 2022-09-08 NOTE — Patient Instructions (Signed)
Monitor BP at home daily in AM

## 2022-09-08 NOTE — Progress Notes (Signed)
Established Patient Visit  Patient: Denise Haas   DOB: 20-May-1945   77 y.o. Female  MRN: [id number] Visit Date: 09/08/2022  Subjective:    Chief Complaint  Patient presents with   Wheezing    Pt has been wheezing everyday and needs refill of albuterol inhaler    HPI Subacute cough Onset 67month ago, productive cough with wheezing at hs resolved with use of medrol dose pack and SABA Symptoms returned 2weeks ago No fever, no SOB Hx of GERD with use of antacid prn. Hx of second hand tobacco smoke exposure as child and adult Clear lung sounds today Peak flow meter: 400, 400, 470ml/min  GERD vs cough variant asthma? Start omeprazole BID and ICS Continue SABA prn F/up in 67month  BP Readings from Last 3 Encounters:  09/08/22 (!) 150/80  07/26/22 130/72  05/31/22 130/78    Reviewed medical, surgical, and social history today  Medications: Outpatient Medications Prior to Visit  Medication Sig   albuterol (VENTOLIN HFA) 108 (90 Base) MCG/ACT inhaler Inhale 1 puff into the lungs every 6 (six) hours as needed for wheezing or shortness of breath.   Calcium Carb-Cholecalciferol (CALCIUM + D3) 600-200 MG-UNIT TABS Take 1 tablet by mouth 2 (two) times daily.   Cholecalciferol 50 MCG (2000 UT) TABS Take 1 tablet by mouth daily at 12 noon.   escitalopram (LEXAPRO) 20 MG tablet Take 1 tablet (20 mg total) by mouth daily.   Melatonin 2.5 MG CAPS Take 1 capsule by mouth Nightly.   Polyvinyl Alcohol-Povidone (REFRESH OP) Apply to eye. Apply one drop each eye prn dry eyes   Sodium Hyaluronate, oral, (HYALURONIC ACID PO) Take by mouth.   tretinoin (RETIN-A) 0.025 % cream Apply topically at bedtime.   [DISCONTINUED] benzonatate (TESSALON) 100 MG capsule Take 1-2 capsules (100-200 mg total) by mouth 3 (three) times daily as needed.   [DISCONTINUED] methylPREDNISolone (MEDROL DOSEPAK) 4 MG TBPK tablet Take as directed on package   Facility-Administered Medications Prior to Visit   Medication Dose Route Frequency Provider   0.9 %  sodium chloride infusion  500 mL Intravenous Once Lynann Bologna, MD   Reviewed past medical and social history.   ROS per HPI above      Objective:  BP (!) 150/80   Pulse 71   Resp 16   Ht 5\' 8"  (1.727 m)   Wt 226 lb (102.5 kg)   SpO2 97%   BMI 34.36 kg/m      Physical Exam Vitals and nursing note reviewed.  Cardiovascular:     Rate and Rhythm: Normal rate and regular rhythm.     Pulses: Normal pulses.     Heart sounds: Normal heart sounds.  Musculoskeletal:     Cervical back: Normal range of motion and neck supple.  Neurological:     Mental Status: She is oriented to person, place, and time.     No results found for any visits on 09/08/22.    Assessment & Plan:    Problem List Items Addressed This Visit       Other   Subacute cough - Primary    Onset 67month ago, productive cough with wheezing at hs resolved with use of medrol dose pack and SABA Symptoms returned 2weeks ago No fever, no SOB Hx of GERD with use of antacid prn. Hx of second hand tobacco smoke exposure as child and adult Clear lung sounds today Peak flow  meter: 400, 400, 454ml/min  GERD vs cough variant asthma? Start omeprazole BID and ICS Continue SABA prn F/up in 28month       Relevant Medications   omeprazole (PRILOSEC) 20 MG capsule   budesonide (PULMICORT) 180 MCG/ACT inhaler   Return in about 4 weeks (around 10/06/2022) for cough, wheezing and elevated BP.     Alysia Penna, NP

## 2022-09-10 ENCOUNTER — Other Ambulatory Visit: Payer: Self-pay | Admitting: Nurse Practitioner

## 2022-09-10 DIAGNOSIS — J209 Acute bronchitis, unspecified: Secondary | ICD-10-CM

## 2022-09-13 ENCOUNTER — Other Ambulatory Visit: Payer: Self-pay | Admitting: Nurse Practitioner

## 2022-09-13 ENCOUNTER — Encounter: Payer: Medicare Other | Admitting: Nurse Practitioner

## 2022-09-13 DIAGNOSIS — E782 Mixed hyperlipidemia: Secondary | ICD-10-CM

## 2022-09-18 ENCOUNTER — Encounter: Payer: Self-pay | Admitting: Nurse Practitioner

## 2022-09-19 ENCOUNTER — Other Ambulatory Visit: Payer: Self-pay

## 2022-09-19 DIAGNOSIS — J209 Acute bronchitis, unspecified: Secondary | ICD-10-CM

## 2022-09-19 DIAGNOSIS — R052 Subacute cough: Secondary | ICD-10-CM

## 2022-09-19 MED ORDER — ALBUTEROL SULFATE HFA 108 (90 BASE) MCG/ACT IN AERS: 1.0000 | INHALATION_SPRAY | Freq: Four times a day (QID) | RESPIRATORY_TRACT | 0 refills | Status: AC | PRN

## 2022-09-21 ENCOUNTER — Other Ambulatory Visit: Payer: Self-pay

## 2022-09-21 ENCOUNTER — Encounter (INDEPENDENT_AMBULATORY_CARE_PROVIDER_SITE_OTHER): Payer: Medicare Other | Admitting: Ophthalmology

## 2022-09-21 ENCOUNTER — Encounter: Payer: Self-pay | Admitting: Nurse Practitioner

## 2022-09-21 DIAGNOSIS — R052 Subacute cough: Secondary | ICD-10-CM

## 2022-09-21 MED ORDER — BUDESONIDE 180 MCG/ACT IN AEPB
1.0000 | INHALATION_SPRAY | Freq: Two times a day (BID) | RESPIRATORY_TRACT | 0 refills | Status: DC
Start: 2022-09-21 — End: 2022-09-22

## 2022-09-22 ENCOUNTER — Other Ambulatory Visit: Payer: Self-pay | Admitting: Nurse Practitioner

## 2022-09-22 ENCOUNTER — Other Ambulatory Visit: Payer: Self-pay | Admitting: Family

## 2022-09-22 DIAGNOSIS — R052 Subacute cough: Secondary | ICD-10-CM

## 2022-09-22 MED ORDER — FLUTICASONE-SALMETEROL 100-50 MCG/ACT IN AEPB: 1.0000 | INHALATION_SPRAY | Freq: Two times a day (BID) | RESPIRATORY_TRACT | 3 refills | Status: AC

## 2022-09-28 ENCOUNTER — Ambulatory Visit: Payer: Medicare Other | Admitting: Nurse Practitioner

## 2022-10-10 ENCOUNTER — Ambulatory Visit (INDEPENDENT_AMBULATORY_CARE_PROVIDER_SITE_OTHER): Payer: Medicare Other | Admitting: Nurse Practitioner

## 2022-10-10 ENCOUNTER — Encounter: Payer: Self-pay | Admitting: Nurse Practitioner

## 2022-10-10 VITALS — BP 138/82 | HR 68 | Temp 98.0°F | Resp 16 | Ht 68.0 in | Wt 227.0 lb

## 2022-10-10 DIAGNOSIS — R052 Subacute cough: Secondary | ICD-10-CM | POA: Diagnosis not present

## 2022-10-10 DIAGNOSIS — Z1231 Encounter for screening mammogram for malignant neoplasm of breast: Secondary | ICD-10-CM

## 2022-10-10 MED ORDER — ALBUTEROL SULFATE (2.5 MG/3ML) 0.083% IN NEBU
2.5000 mg | INHALATION_SOLUTION | Freq: Once | RESPIRATORY_TRACT | Status: AC
Start: 2022-10-10 — End: 2022-10-10
  Administered 2022-10-10: 2.5 mg via RESPIRATORY_TRACT

## 2022-10-10 NOTE — Progress Notes (Signed)
Gave breathing treatment using nebulizer machine and tubing

## 2022-10-10 NOTE — Patient Instructions (Signed)
Resume use of omeprazole 20mg  BID x 2weeks Go to [address] for CXR Continue albuterol as needed Stop advair

## 2022-10-10 NOTE — Progress Notes (Signed)
Established Patient Visit  Patient: Denise Haas   DOB: 01-17-1946   77 y.o. Female  MRN: [id number] Visit Date: 10/13/2022  Subjective:    Chief Complaint  Patient presents with   Bronchitis    Feeling better but it's lingering    Cough   HPI Subacute cough Resolved cough and wheezing at hs Reports persistent chest tightness even with use of SABA Current use of  ICS/LABA BID She did not take omeprazole as prescribed No night sweats, no fever, no SOB.  Normal spirometry today: FEV1/FVC ratio at 77%, FVC at 83% Stop ICS/LABA Continue SABA prn Get CXR: normal Advised to start omeprazole Entered referral to ENT  Reviewed medical, surgical, and social history today  Medications: Outpatient Medications Prior to Visit  Medication Sig   albuterol (VENTOLIN HFA) 108 (90 Base) MCG/ACT inhaler Inhale 1 puff into the lungs every 6 (six) hours as needed for wheezing or shortness of breath.   Calcium Carb-Cholecalciferol (CALCIUM + D3) 600-200 MG-UNIT TABS Take 1 tablet by mouth 2 (two) times daily.   Cholecalciferol 50 MCG (2000 UT) TABS Take 1 tablet by mouth daily at 12 noon.   escitalopram (LEXAPRO) 20 MG tablet Take 1 tablet (20 mg total) by mouth daily.   fluticasone-salmeterol (ADVAIR) 100-50 MCG/ACT AEPB Inhale 1 puff into the lungs 2 (two) times daily.   Melatonin 2.5 MG CAPS Take 1 capsule by mouth Nightly.   omeprazole (PRILOSEC) 20 MG capsule TAKE ONE CAPSULE BY MOUTH TWICE A DAY BEFORE A MEAL   Polyvinyl Alcohol-Povidone (REFRESH OP) Apply to eye. Apply one drop each eye prn dry eyes   Sodium Hyaluronate, oral, (HYALURONIC ACID PO) Take by mouth.   tretinoin (RETIN-A) 0.025 % cream Apply topically at bedtime.   Facility-Administered Medications Prior to Visit  Medication Dose Route Frequency Provider   0.9 %  sodium chloride infusion  500 mL Intravenous Once Lynann Bologna, MD   Reviewed past medical and social history.   ROS per HPI above       Objective:  BP 138/82 (BP Location: Left Arm, Patient Position: Sitting, Cuff Size: Large)   Pulse 68   Temp 98 F (36.7 C) (Temporal)   Resp 16   Ht 5\' 8"  (1.727 m)   Wt 227 lb (103 kg)   SpO2 98%   BMI 34.52 kg/m      Physical Exam Vitals and nursing note reviewed.  Cardiovascular:     Rate and Rhythm: Normal rate and regular rhythm.     Pulses: Normal pulses.     Heart sounds: Normal heart sounds.  Pulmonary:     Effort: Pulmonary effort is normal.     Breath sounds: Normal breath sounds.  Musculoskeletal:     Right lower leg: No edema.     Left lower leg: No edema.  Neurological:     Mental Status: She is oriented to person, place, and time.     No results found for any visits on 10/10/22.    Assessment & Plan:    Problem List Items Addressed This Visit       Other   Subacute cough - Primary    Resolved cough and wheezing at hs Reports persistent chest tightness even with use of SABA Current use of  ICS/LABA BID She did not take omeprazole as prescribed No night sweats, no fever, no SOB.  Normal spirometry today: FEV1/FVC ratio at 77%,  FVC at 83% Stop ICS/LABA Continue SABA prn Get CXR: normal Advised to start omeprazole Entered referral to ENT      Relevant Orders   PR BRNCDILAT RSPSE SPMTRY PRE&POST-BRNCDILAT ADMN   DG Chest 2 View (Completed)   Other Visit Diagnoses     Screening mammogram for breast cancer       Relevant Orders   MM DIGITAL SCREENING BILATERAL      Return if symptoms worsen or fail to improve.     Alysia Penna, NP

## 2022-10-11 ENCOUNTER — Ambulatory Visit (HOSPITAL_COMMUNITY)
Admission: RE | Admit: 2022-10-11 | Discharge: 2022-10-11 | Disposition: A | Discharge status: Home / Self Care | Payer: Medicare Other | Source: Ambulatory Visit | Attending: Nurse Practitioner | Admitting: Nurse Practitioner

## 2022-10-11 DIAGNOSIS — R052 Subacute cough: Secondary | ICD-10-CM | POA: Insufficient documentation

## 2022-10-13 ENCOUNTER — Encounter: Payer: Self-pay | Admitting: Nurse Practitioner

## 2022-10-13 ENCOUNTER — Other Ambulatory Visit: Payer: Self-pay | Admitting: Nurse Practitioner

## 2022-10-13 DIAGNOSIS — R052 Subacute cough: Secondary | ICD-10-CM

## 2022-10-13 NOTE — Assessment & Plan Note (Signed)
Resolved cough and wheezing at hs Reports persistent chest tightness even with use of SABA Current use of  ICS/LABA BID She did not take omeprazole as prescribed No night sweats, no fever, no SOB.  Normal spirometry today: FEV1/FVC ratio at 77%, FVC at 83% Stop ICS/LABA Continue SABA prn Get CXR: normal Advised to start omeprazole Entered referral to ENT

## 2022-10-26 ENCOUNTER — Encounter (INDEPENDENT_AMBULATORY_CARE_PROVIDER_SITE_OTHER): Payer: Self-pay

## 2022-10-26 ENCOUNTER — Other Ambulatory Visit: Payer: Self-pay | Admitting: Nurse Practitioner

## 2022-10-26 DIAGNOSIS — R052 Subacute cough: Secondary | ICD-10-CM

## 2022-10-26 MED ORDER — OMEPRAZOLE 20 MG PO CPDR: 20.0000 mg | DELAYED_RELEASE_CAPSULE | Freq: Every day | ORAL | 0 refills | Status: DC

## 2022-10-26 NOTE — Telephone Encounter (Signed)
Called and talked to patient.  Needed to verify which [facility] to send medication.  Per patient, send to the one on [address] in [place].

## 2022-11-14 ENCOUNTER — Ambulatory Visit: Payer: Medicare Other | Admitting: Nurse Practitioner

## 2022-11-21 ENCOUNTER — Ambulatory Visit: Payer: Medicare Other | Admitting: Dermatology

## 2022-12-03 ENCOUNTER — Other Ambulatory Visit: Payer: Self-pay | Admitting: Nurse Practitioner

## 2022-12-03 DIAGNOSIS — R052 Subacute cough: Secondary | ICD-10-CM

## 2022-12-27 ENCOUNTER — Other Ambulatory Visit: Payer: Self-pay | Admitting: Nurse Practitioner

## 2022-12-27 DIAGNOSIS — F411 Generalized anxiety disorder: Secondary | ICD-10-CM

## 2023-01-01 ENCOUNTER — Encounter: Payer: Self-pay | Admitting: Dermatology

## 2023-01-01 ENCOUNTER — Ambulatory Visit: Payer: Medicare Other | Admitting: Dermatology

## 2023-01-01 VITALS — BP 120/80

## 2023-01-01 DIAGNOSIS — D1801 Hemangioma of skin and subcutaneous tissue: Secondary | ICD-10-CM

## 2023-01-01 DIAGNOSIS — W908XXA Exposure to other nonionizing radiation, initial encounter: Secondary | ICD-10-CM | POA: Diagnosis not present

## 2023-01-01 DIAGNOSIS — Z1283 Encounter for screening for malignant neoplasm of skin: Secondary | ICD-10-CM

## 2023-01-01 DIAGNOSIS — L814 Other melanin hyperpigmentation: Secondary | ICD-10-CM | POA: Diagnosis not present

## 2023-01-01 DIAGNOSIS — L821 Other seborrheic keratosis: Secondary | ICD-10-CM

## 2023-01-01 DIAGNOSIS — L578 Other skin changes due to chronic exposure to nonionizing radiation: Secondary | ICD-10-CM | POA: Diagnosis not present

## 2023-01-01 DIAGNOSIS — L853 Xerosis cutis: Secondary | ICD-10-CM

## 2023-01-01 DIAGNOSIS — Z808 Family history of malignant neoplasm of other organs or systems: Secondary | ICD-10-CM

## 2023-01-01 DIAGNOSIS — R21 Rash and other nonspecific skin eruption: Secondary | ICD-10-CM

## 2023-01-01 DIAGNOSIS — D229 Melanocytic nevi, unspecified: Secondary | ICD-10-CM

## 2023-01-01 MED ORDER — CLOBETASOL PROPIONATE 0.05 % EX OINT
1.0000 | TOPICAL_OINTMENT | Freq: Two times a day (BID) | CUTANEOUS | 1 refills | Status: AC
Start: 2023-01-01 — End: ?

## 2023-01-01 NOTE — Progress Notes (Signed)
   New Patient Visit   Subjective  Denise Haas is a 77 y.o. female who presents for the following: Skin Cancer Screening and Full Body Skin Exam  The patient presents for Total-Body Skin Exam (TBSE) for skin cancer screening and mole check. The patient has spots, moles and lesions to be evaluated, some may be new or changing.  Patients last dermatology appointment was about a year ago. She has had numerous places removed but did not come back as abnormal. Mother hx of SCC. Father has hx of skin cancer. Spot of concern is on right leg. Has been present for about six months  The following portions of the chart were reviewed this encounter and updated as appropriate: medications, allergies, medical history  Review of Systems:  No other skin or systemic complaints except as noted in HPI or Assessment and Plan.  Objective  Well appearing patient in no apparent distress; mood and affect are within normal limits.  A full examination was performed including scalp, head, eyes, ears, nose, lips, neck, chest, axillae, abdomen, back, buttocks, bilateral upper extremities, bilateral lower extremities, hands, feet, fingers, toes, fingernails, and toenails. All findings within normal limits unless otherwise noted below.   Relevant physical exam findings are noted in the Assessment and Plan.   Assessment & Plan   SKIN CANCER SCREENING PERFORMED TODAY.  ACTINIC DAMAGE - Chronic condition, secondary to cumulative UV/sun exposure - diffuse scaly erythematous macules with underlying dyspigmentation - Recommend daily broad spectrum sunscreen SPF 30+ to sun-exposed areas, reapply every 2 hours as needed.  - Staying in the shade or wearing long sleeves, sun glasses (UVA+UVB protection) and wide brim hats (4-inch brim around the entire circumference of the hat) are also recommended for sun protection.  - Call for new or changing lesions.  LENTIGINES, SEBORRHEIC KERATOSES, HEMANGIOMAS - Benign normal skin  lesions - Benign-appearing - Call for any changes  MELANOCYTIC NEVI - Tan-brown and/or pink-flesh-colored symmetric macules and papules - Benign appearing on exam today - Observation - Call clinic for new or changing moles - Recommend daily use of broad spectrum spf 30+ sunscreen to sun-exposed areas.   RASH- Right lower leg- Nummular Eczema vs Psoriasis vs OTher Exam: Well-demarcated erythematous papules/plaques with silvery scale, guttate pink scaly papules- flared  - Discussed that lesion may be psoriasis, recommend topical steroid regimen and to follow up in several months  Psoriasis is a chronic non-curable, but treatable genetic/hereditary disease that may have other systemic features affecting other organ systems such as joints (Psoriatic Arthritis). It is associated with an increased risk of inflammatory bowel disease, heart disease, non-alcoholic fatty liver disease, and depression.  Treatments include light and laser treatments; topical medications; and systemic medications including oral and injectables.  Treatment Plan: Clobetasol Ointment BID as needed  Xerosis- bilateral lower extremities - diffuse xerotic patches - recommend gentle, hydrating skin care - gentle skin care handout given  Return in about 6 weeks (around 02/12/2023) for psoriasis.  I, Germaine Pomfret, CMA, am acting as scribe for Gwenith Daily, MD.   Documentation: I have reviewed the above documentation for accuracy and completeness, and I agree with the above.  Gwenith Daily, MD

## 2023-01-10 ENCOUNTER — Ambulatory Visit
Admission: RE | Admit: 2023-01-10 | Discharge: 2023-01-10 | Disposition: A | Discharge status: Home / Self Care | Payer: Medicare Other | Source: Ambulatory Visit | Attending: Nurse Practitioner | Admitting: Nurse Practitioner

## 2023-01-10 DIAGNOSIS — Z1231 Encounter for screening mammogram for malignant neoplasm of breast: Secondary | ICD-10-CM

## 2023-02-12 ENCOUNTER — Ambulatory Visit: Payer: Medicare Other | Admitting: Dermatology
